# Patient Record
Sex: Female | Born: 1995 | Race: Black or African American | Hispanic: No | Marital: Married | Smoking: Never smoker
Health system: Southern US, Community
[De-identification: ages and names within clinical notes are randomized; demographics above are authoritative.]

## PROBLEM LIST (undated history)

## (undated) DIAGNOSIS — F419 Anxiety disorder, unspecified: Secondary | ICD-10-CM

## (undated) DIAGNOSIS — J45909 Unspecified asthma, uncomplicated: Secondary | ICD-10-CM

## (undated) DIAGNOSIS — M519 Unspecified thoracic, thoracolumbar and lumbosacral intervertebral disc disorder: Secondary | ICD-10-CM

## (undated) DIAGNOSIS — F329 Major depressive disorder, single episode, unspecified: Secondary | ICD-10-CM

## (undated) DIAGNOSIS — M549 Dorsalgia, unspecified: Secondary | ICD-10-CM

## (undated) DIAGNOSIS — K259 Gastric ulcer, unspecified as acute or chronic, without hemorrhage or perforation: Secondary | ICD-10-CM

## (undated) DIAGNOSIS — T7840XA Allergy, unspecified, initial encounter: Secondary | ICD-10-CM

## (undated) DIAGNOSIS — F32A Depression, unspecified: Secondary | ICD-10-CM

## (undated) HISTORY — DX: Anxiety disorder, unspecified: F41.9

## (undated) HISTORY — DX: Major depressive disorder, single episode, unspecified: F32.9

## (undated) HISTORY — DX: Allergy, unspecified, initial encounter: T78.40XA

## (undated) HISTORY — DX: Gastric ulcer, unspecified as acute or chronic, without hemorrhage or perforation: K25.9

## (undated) HISTORY — DX: Dorsalgia, unspecified: M54.9

## (undated) HISTORY — DX: Depression, unspecified: F32.A

---
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
## 2015-01-15 ENCOUNTER — Ambulatory Visit: Payer: Medicaid Other | Attending: Family Medicine | Admitting: Physical Therapy

## 2015-01-15 ENCOUNTER — Encounter: Payer: Self-pay | Admitting: Physical Therapy

## 2015-01-15 DIAGNOSIS — M79672 Pain in left foot: Secondary | ICD-10-CM

## 2015-01-15 DIAGNOSIS — M25562 Pain in left knee: Secondary | ICD-10-CM | POA: Diagnosis present

## 2015-01-15 DIAGNOSIS — R262 Difficulty in walking, not elsewhere classified: Secondary | ICD-10-CM | POA: Diagnosis not present

## 2015-01-15 NOTE — Therapy (Signed)
[facility] MAIN [facility] SERVICES [address] [place], [place], [address] Phone: [phone]   Fax:  [phone]  Physical Therapy Evaluation  Patient Details  Name: Jordan David MRN: [id number] Date of Birth: 1996-05-12 Referring Provider:  Mickel Fuchs, MD  Encounter Date: 01/15/2015      PT End of Session - 01/15/15 1156    Visit Number 1   Number of Visits 9   Date for PT Re-Evaluation 02/19/15   Authorization Type medicaid   PT Start Time 1047   PT Stop Time 1135   PT Time Calculation (min) 48 min   Activity Tolerance Patient tolerated treatment well;Patient limited by pain   Behavior During Therapy [facility] for tasks assessed/performed      Past Medical History  Diagnosis Date  . Anxiety     controlled with medication  . Depression     controlled with medication  . Stomach ulcer 2015  . Back pain     scoliosis and dislocated disc (>1 year of pain)  . Allergy     History reviewed. No pertinent past surgical history.  There were no vitals filed for this visit.  Visit Diagnosis:  Left foot pain - Plan: PT plan of care cert/re-cert  Left knee pain - Plan: PT plan of care cert/re-cert  Difficulty walking - Plan: PT plan of care cert/re-cert      Subjective Assessment - 01/15/15 1056    Subjective 19 yo Female reports increased LLE pain/knee pain and down to foot. Patient reports increased difficulty standing at work. Patient went to see physician. 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 Patient picked up a 2nd job at [employer].    Pertinent History Patient reports having pain in LLE when she was younger. Went to [facility] and was diagnosed with growing pain; She reports always dragging LLE; She reports increased back pain earlier this year and received PT for it. She reports that her back  was feeling better until she started feeling increased LLE pain. She  reports not following up with anyone about her back at this time.    Limitations Standing;Walking   How long can you sit comfortably? 20 min (increased back pain); no pain with sitting in LLE;   How long can you stand comfortably? 10 min   How long can you walk comfortably? reports limping with gait (able to walk >500 feet)   Diagnostic tests Had radiographs of LLE when younger, no abnormalities reported;    Patient Stated Goals reduce pain, be able to work without difficulty   Currently in Pain? Yes   Pain Score 6    Pain Location Knee   Pain Orientation Left   Pain Descriptors / Indicators Aching;Throbbing   Pain Type Chronic pain   Pain Radiating Towards left foot   Pain Onset More than a month ago   Pain Frequency Constant   Aggravating Factors  standing/walking   Pain Relieving Factors rest (lying down)   Effect of Pain on Daily Activities decreased standing tolerance at work;             [facility] PT Assessment - 01/15/15 1101    Assessment   Medical Diagnosis LLE pain/knee and foot pain (plantar fasciitis, pes planus)   Onset Date/Surgical Date 12/11/14  felt sharp pain, pain has stayed since; no injury  reported;   Hand Dominance Right   Next MD Visit none scheduled   Prior Therapy had PT earlier in the year for back pain; reports no PT for this condition;    Precautions   Precautions None   Restrictions   Weight Bearing Restrictions No   Balance Screen   Has the patient fallen in the past 6 months No   Has the patient had a decrease in activity level because of a fear of falling?  No   Is the patient reluctant to leave their home because of a fear of falling?  Yes   Home Environment   Additional Comments lives with mom; lives in single story home with 5-6 steps to enter house with R rail; has to negotiate steps one step at a time due to pain;    Prior Function   Level of Independence Independent    Vocation Part time employment  2 part time jobs  (works 40+ hours a week)   Gaffer standing, lifting   Leisure travel/bowling/shopping   Cognition   Overall Cognitive Status Within Functional Limits for tasks assessed   Observation/Other Assessments   Observations demonstrates increased lumbar lordosis in standing; equal shoulder height bilaterally and equal hip height; increased flat foot bilaterally and increased knee valgus in standing (mild)   Skin Integrity intact by gross assessment   Sensation   Additional Comments sometimes will have numbness in LLE knee/ankle; intact light touch during evaluation;    AROM   Overall AROM Comments BUE and BLE are WFL except left ankle; lumbar ROM: normal lumbar flexion/extension, rotation and side bend bilaterally; increased pain with lumbar extension;    Right Ankle Dorsiflexion 14   Right Ankle Plantar Flexion 80   Right Ankle Inversion 40   Right Ankle Eversion 20   Left Ankle Dorsiflexion -5   Left Ankle Plantar Flexion 60   Left Ankle Inversion 25   Left Ankle Eversion 23   Strength   Overall Strength Comments BUE and BLE are WFL, LLE: knee grossly 4-/5, ankle: 4/5   Palpation   Patella mobility hypermobile LLE lateral patellar glides as compared to right; normal inferior/superior bilaterally; no pain reported with patella glides   Palpation comment reports tenderness along left tibial plateu and quad tendon   Anterior drawer test   Findings Negative   Side Left   Posterior drawer test   Findings Negative   Side  Left   Other   comment negative valgus/varus stress test bilaterally;   Straight Leg Raise   Findings Negative   Side  Left   Comment no back pain; increased tightness in hamstring   Patellofemoral Apprehension Test    Findings Negative   Side  Left   Transfers   Comments able to transfer sit<>Stand without HHA from regular height chair   Ambulation/Gait   Gait Comments ambulates with slight antalgic  gait pattern left with short stance time and decreased step length on RLE; Patient ambulates with increased toe out/wide base of support with increased lumbar lordosis;    Standardized Balance Assessment   Five times sit to stand comments  10.5 sec without HHA (>10 sec indicates increased risk for falls)        Initiated HEP instructing patient in standing calf stretch against wall, 20 sec x1 bilaterally and heel off step stretch 20 sec hold x1 bilaterally; See patient instructions;  PT Education - 01/15/15 1155    Education provided Yes   Education Details HEP- see patient instructions   Person(s) Educated Patient   Methods Explanation;Verbal cues;Handout   Comprehension Verbalized understanding;Verbal cues required             PT Long Term Goals - 01/15/15 1202    PT LONG TERM GOAL #1   Title Patient will increase BLE gross strength to 5/5 as to improve functional strength for independent gait, increased standing tolerance and increased ADL ability by 02/19/15   Baseline LLE knee grossly 4-/5, ankle 4/5   Time 4   Period Weeks   Status New   PT LONG TERM GOAL #2   Title Patient will report a worst pain of 3/10 on VAS in  LLE foot/leg  to improve tolerance with ADLs and reduced symptoms with activities by 02/19/15   Baseline worst pain of 9/10 with standing/walking   Time 4   Period Weeks   Status New   PT LONG TERM GOAL #3   Title Patient will increase LLE ankle AROM: DF: 10 degrees, IV: >30 degrees to improve functional ROM for gait/walking tasks by 02/19/15   Baseline LLE: ankle AROM: DF - 5 degrees, PF: 60 degrees, IV: 25 degrees, EV: 23 degrees   Time 4   Period Weeks   Status New   PT LONG TERM GOAL #4   Title Patient will ascend/descend 4+stairs without rail assist independently with reciprocal pattern to improve ability to get in/out of home by 02/19/15   Baseline currently needs rail assist and negotiates steps one step at a time    Time 4   Period Weeks   Status New   PT LONG TERM GOAL #5   Title Patient will be independent in home exercise program to improve strength/mobility for better functional independence with ADLs by 02/19/15   Baseline Patient does not have a HEP   Time 4   Period Weeks   Status New               Plan - 01/15/15 1156    Clinical Impression Statement 19 yo Female presents to therapy with increased LLE pain. Patient reports increased pain last month with insideous onset. Patient exhibits increased LLE knee valgus, increased LLE flat foot with wide base of support and increased toe out in stance. Patient reports increased pain with LLE heel which radiates up to left knee and then increases to back pain. Patient exhibits decreased LLE ankle ROM and increased tenderness along plantar fascia. She exhibits good knee stability with negative laxity tests. She does have increased LLE lateral patella mobility during mobilization as compared to RLE. Patient ambulates with slight antalgic gait pattern left due to increased pain. Patient currently ambulates either barefoot or with non-supportive shoes. She would benefit from education on supportive shoewear to reduce foot pain. She would benefit from additional skilled PT intervention to improve LLE rom, reduce pain and increase tolerance with ADLs.    Pt will benefit from skilled therapeutic intervention in order to improve on the following deficits Decreased strength;Difficulty walking;Postural dysfunction;Decreased mobility;Impaired flexibility;Decreased range of motion;Hypermobility;Pain;Decreased endurance   Rehab Potential Good   Clinical Impairments Affecting Rehab Potential positive: young age, minimal co-morbidities; negative: chronic pain   PT Frequency 2x / week   PT Duration 4 weeks   PT Treatment/Interventions Gait training;Manual techniques;Functional mobility training;Therapeutic activities;Therapeutic exercise;Cryotherapy;Electrical  Stimulation;Iontophoresis 4mg /ml Dexamethasone;Balance training;Neuromuscular re-education;Passive range of motion;Dry needling;Moist Heat;Ultrasound;Patient/family education;Taping;Orthotic Fit/Training   PT Next  Visit Plan LEFs, initiate HEP   PT Home Exercise Plan see patient instructions   Consulted and Agree with Plan of Care Patient         Problem List There are no active problems to display for this patient.   Hopkins,Margaret, PT, DPT 01/15/2015, 12:08 PM  [facility] [address] [place], [place], [address] Phone: [phone]   Fax:  [phone]

## 2015-01-15 NOTE — Patient Instructions (Signed)
Achilles Tendon Stretch   Stand with hands supported on wall, elbows slightly bent, feet parallel and both heels on floor, front knee bent, back knee straight. Slowly relax back knee until a stretch is felt in achilles tendon. Hold _20___ seconds. Repeat with leg positions switched.    Copyright  VHI. All rights reserved.  ANKLE: Dorsiflexion, Step Unilateral   Stand on step, hang one heel off back of step. Hold _20__ seconds. __3_ reps per set, __2_ sets per day, _5_ days per week Hold onto a support.  Copyright  VHI. All rights reserved.

## 2015-01-20 ENCOUNTER — Encounter: Payer: Medicaid Other | Admitting: Physical Therapy

## 2015-01-22 ENCOUNTER — Encounter: Payer: Medicaid Other | Admitting: Physical Therapy

## 2015-01-27 ENCOUNTER — Encounter: Payer: Medicaid Other | Admitting: Physical Therapy

## 2015-01-30 ENCOUNTER — Ambulatory Visit: Payer: Medicaid Other | Attending: Family Medicine | Admitting: Physical Therapy

## 2015-01-30 ENCOUNTER — Encounter: Payer: Self-pay | Admitting: Physical Therapy

## 2015-01-30 DIAGNOSIS — M79672 Pain in left foot: Secondary | ICD-10-CM | POA: Insufficient documentation

## 2015-01-30 DIAGNOSIS — R262 Difficulty in walking, not elsewhere classified: Secondary | ICD-10-CM | POA: Diagnosis present

## 2015-01-30 DIAGNOSIS — M25562 Pain in left knee: Secondary | ICD-10-CM | POA: Diagnosis present

## 2015-01-30 NOTE — Therapy (Signed)
[facility] MAIN [facility] SERVICES [address] [place], [place], [address] Phone: [phone]   Fax:  [phone]  Physical Therapy Treatment  Patient Details  Name: Jordan David MRN: [id number] Date of Birth: 08/05/1995 Referring Provider:  Mickel Fuchs, MD  Encounter Date: 01/30/2015      PT End of Session - 01/30/15 1707    Visit Number (p) 2   Number of Visits (p) 9   Date for PT Re-Evaluation (p) 02/19/15   Authorization Type (p) medicaid   Activity Tolerance (p) Patient tolerated treatment well;Patient limited by pain   Behavior During Therapy (p) WFL for tasks assessed/performed      Past Medical History  Diagnosis Date  . Anxiety     controlled with medication  . Depression     controlled with medication  . Stomach ulcer 2015  . Back pain     scoliosis and dislocated disc (>1 year of pain)  . Allergy     History reviewed. No pertinent past surgical history.  There were no vitals filed for this visit.  Visit Diagnosis:  Left foot pain  Left knee pain  Difficulty walking      Subjective Assessment - 01/30/15 1659    Subjective Patient is having 8/10 pain to left foot and she has been advised to get a more supportative shoe and ice it often. She was instructed in stretching it and the importance of performing her HEP.    Pertinent History Patient reports having pain in LLE when she was younger. Went to [facility] and was diagnosed with growing pain; She reports always dragging LLE; She reports increased back pain earlier this year and received PT for it. She reports that her back was feeling better until she started feeling increased LLE pain. She  reports not following up with anyone about her back at this time.    Limitations Standing;Walking   How long can you sit comfortably? 20 min (increased back pain); no pain with sitting in LLE;   How long can you stand comfortably? 10 min   How long can you walk  comfortably? reports limping with gait (able to walk >500 feet)   Diagnostic tests Had radiographs of LLE when younger, no abnormalities reported;    Patient Stated Goals reduce pain, be able to work without difficulty   Pain Score 8    Pain Location --  foot   Pain Type Chronic pain   Pain Onset More than a month ago   Aggravating Factors  Standing /walking      Patient was seen for standing stretch to gastroc and soleus 30 sec x 3 x 2 reps Stair stretch for gastroc and soleus 30 sec x 3 BLE  Ice to left foot  [place] to left heel and foot x 10 minutes at 1.5 cm squared Patient is having 8/10 pain to left heel.  Patient was educated that she needs to get a better shoe to support her foot, ice and stretch her foot often.                              PT Education - 01/30/15 1706    Education provided Yes   Education Details ice and stretch multiple times per day and get suportative shoes   Person(s) Educated Patient   Methods Explanation   Comprehension Verbalized understanding  PT Long Term Goals - 01/15/15 1202    PT LONG TERM GOAL #1   Title Patient will increase BLE gross strength to 5/5 as to improve functional strength for independent gait, increased standing tolerance and increased ADL ability by 02/19/15   Baseline LLE knee grossly 4-/5, ankle 4/5   Time 4   Period Weeks   Status New   PT LONG TERM GOAL #2   Title Patient will report a worst pain of 3/10 on VAS in  LLE foot/leg  to improve tolerance with ADLs and reduced symptoms with activities by 02/19/15   Baseline worst pain of 9/10 with standing/walking   Time 4   Period Weeks   Status New   PT LONG TERM GOAL #3   Title Patient will increase LLE ankle AROM: DF: 10 degrees, IV: >30 degrees to improve functional ROM for gait/walking tasks by 02/19/15   Baseline LLE: ankle AROM: DF - 5 degrees, PF: 60 degrees, IV: 25 degrees, EV: 23 degrees   Time 4   Period Weeks   Status New    PT LONG TERM GOAL #4   Title Patient will ascend/descend 4+stairs without rail assist independently with reciprocal pattern to improve ability to get in/out of home by 02/19/15   Baseline currently needs rail assist and negotiates steps one step at a time   Time 4   Period Weeks   Status New   PT LONG TERM GOAL #5   Title Patient will be independent in home exercise program to improve strength/mobility for better functional independence with ADLs by 02/19/15   Baseline Patient does not have a HEP   Time 4   Period Weeks   Status New               Problem List There are no active problems to display for this patient.   Ezekiel Ina 01/30/2015, 5:32 PM  [facility] MAIN [facility] SERVICES [address] [place], [place], [address] Phone: [phone]   Fax:  [phone]

## 2015-02-01 IMAGING — CR DG CHEST 2V
1 series · 2 of 2 positions shown · non-contrast
Comparison: none

REASON FOR EXAM: cough, intermittent fevers
COMMENTS:

PROCEDURE:     DXR - DXR CHEST PA (OR AP) AND LATERAL  - September 18, 2012  [DATE]
RESULT:     The lungs are clear. The heart and pulmonary vessels are normal.
The bony and mediastinal structures are unremarkable. There is no effusion.
There is no pneumothorax or evidence of congestive failure.

[Series 1: w chest pa · 0.14mm/px · 2 of 2 slices shown]
[im 1/2]
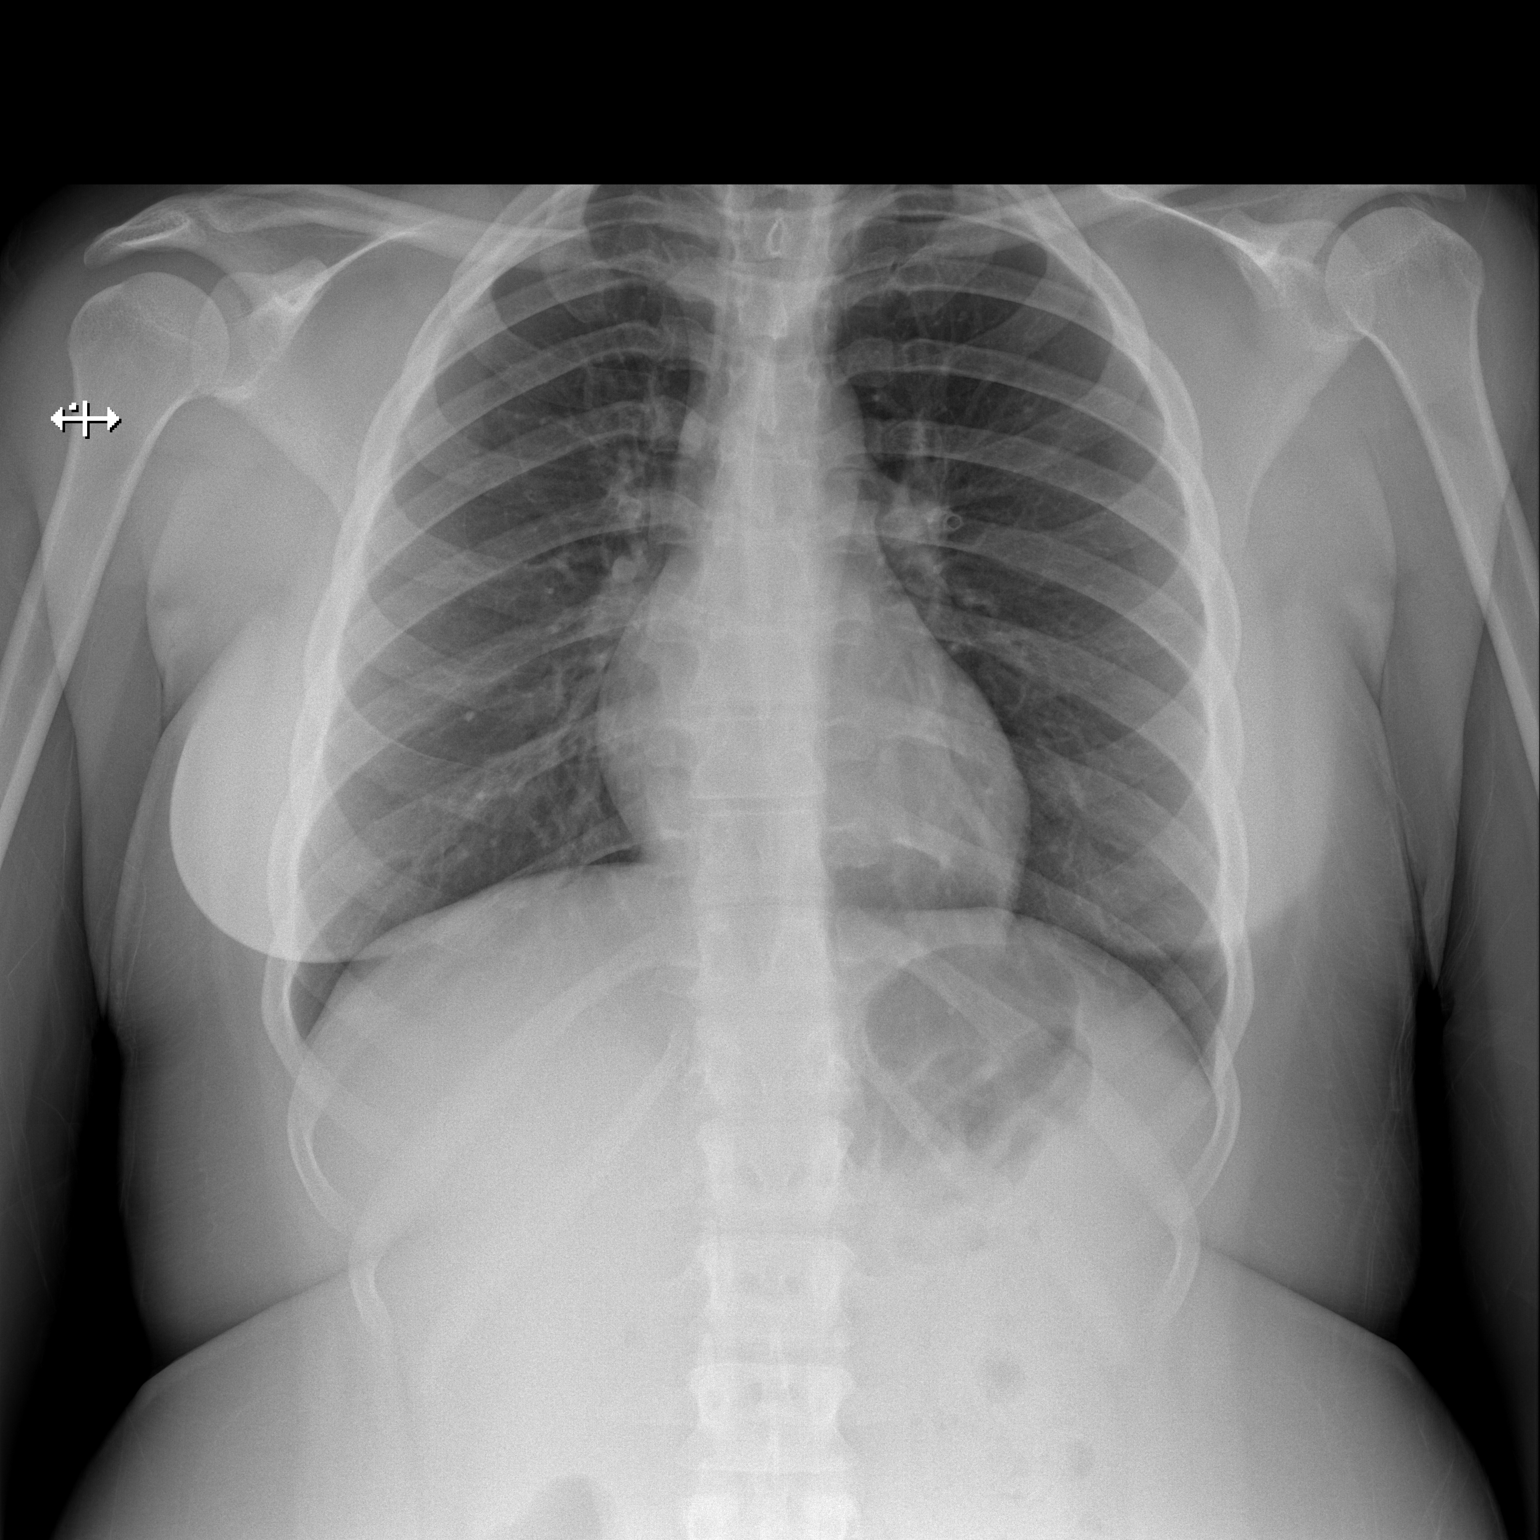
[im 2/2]
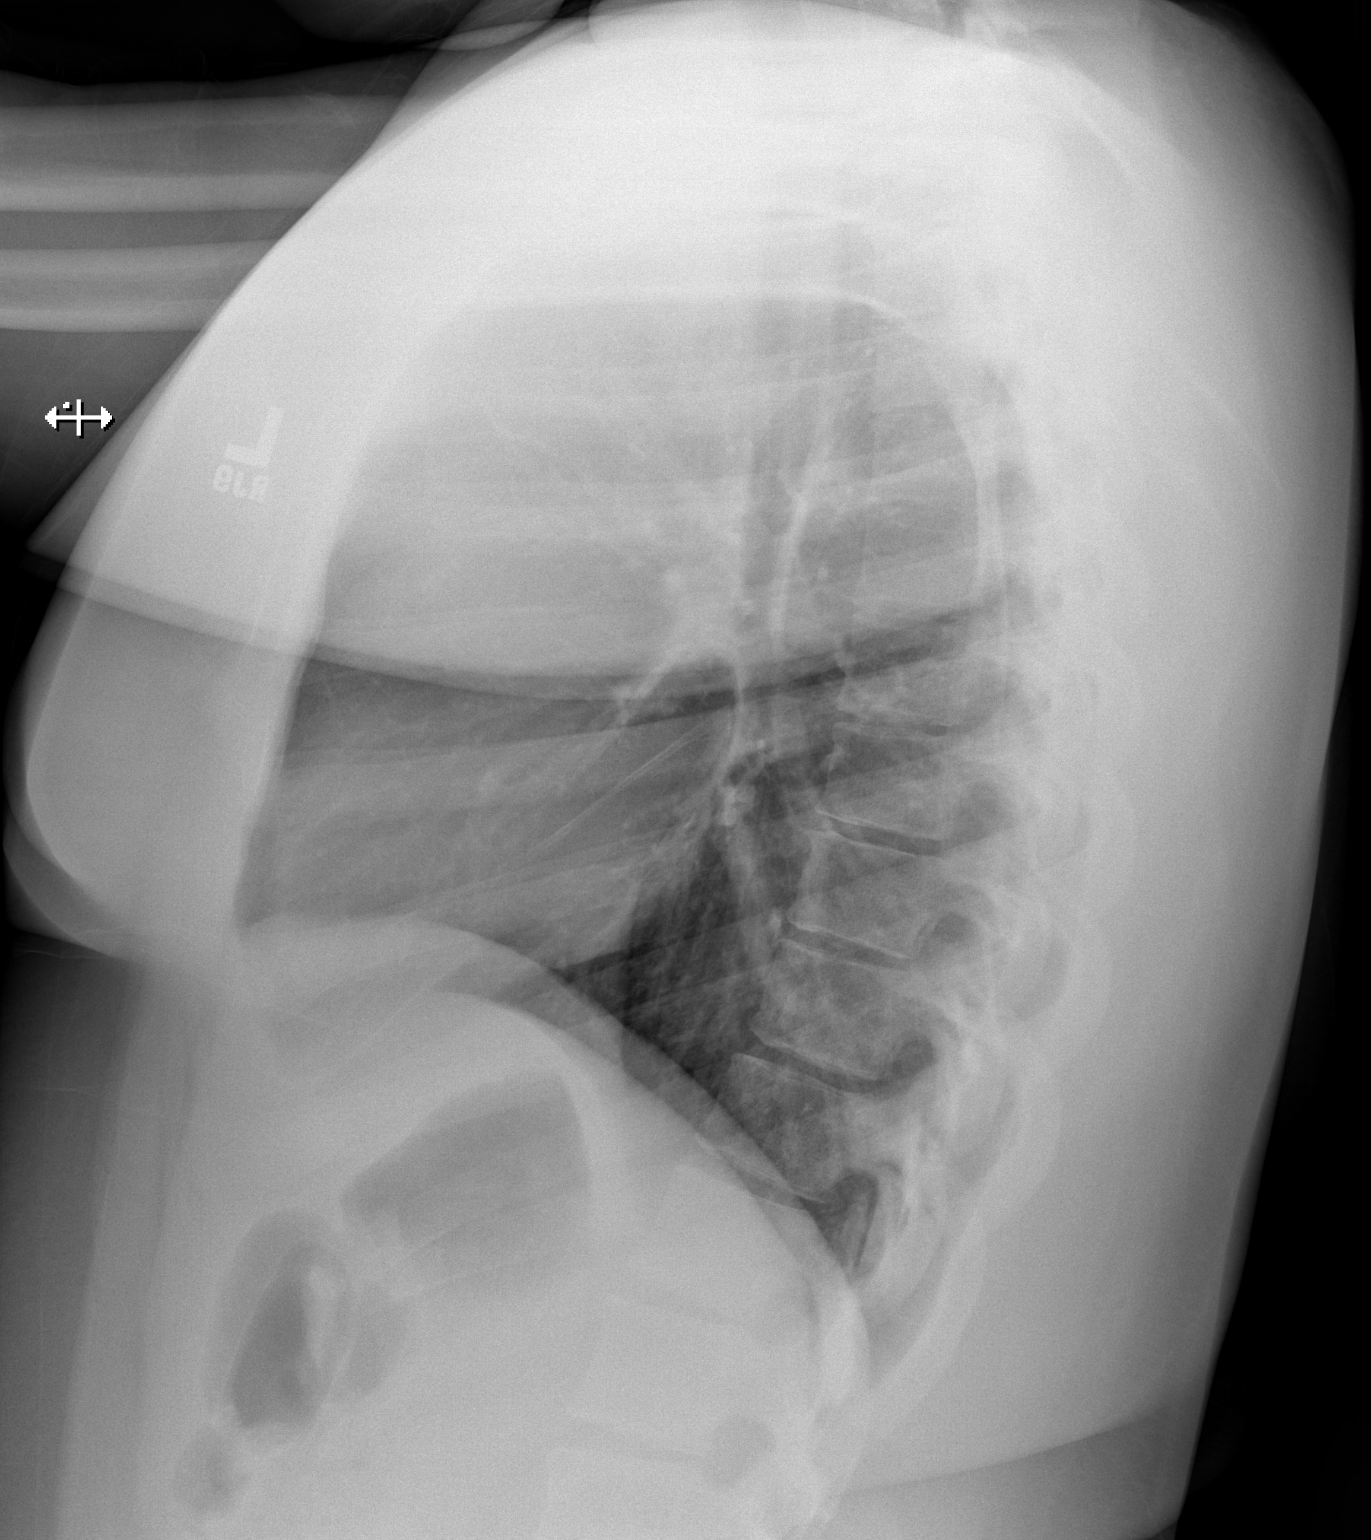

[2 of 2 positions shown; findings below may reference images not displayed]

IMPRESSION: No acute cardiopulmonary disease.

[REDACTED]

## 2015-02-03 ENCOUNTER — Emergency Department
Admission: EM | Admit: 2015-02-03 | Discharge: 2015-02-03 | Disposition: A | Discharge status: Home / Self Care | Payer: Medicaid Other | Attending: Emergency Medicine | Admitting: Emergency Medicine

## 2015-02-03 ENCOUNTER — Encounter: Payer: Self-pay | Admitting: Emergency Medicine

## 2015-02-03 ENCOUNTER — Ambulatory Visit: Payer: Medicaid Other | Admitting: Physical Therapy

## 2015-02-03 DIAGNOSIS — K529 Noninfective gastroenteritis and colitis, unspecified: Secondary | ICD-10-CM | POA: Insufficient documentation

## 2015-02-03 DIAGNOSIS — Z3202 Encounter for pregnancy test, result negative: Secondary | ICD-10-CM | POA: Insufficient documentation

## 2015-02-03 DIAGNOSIS — R112 Nausea with vomiting, unspecified: Secondary | ICD-10-CM | POA: Diagnosis present

## 2015-02-03 LAB — URINALYSIS COMPLETE WITH MICROSCOPIC (ARMC ONLY)
Bacteria, UA: NONE SEEN
Bilirubin Urine: NEGATIVE
Glucose, UA: 50 mg/dL — AB
HGB URINE DIPSTICK: NEGATIVE
KETONES UR: NEGATIVE mg/dL
Nitrite: NEGATIVE
PH: 6 (ref 5.0–8.0)
PROTEIN: NEGATIVE mg/dL
SPECIFIC GRAVITY, URINE: 1.015 (ref 1.005–1.030)

## 2015-02-03 LAB — COMPREHENSIVE METABOLIC PANEL
ALT: 26 U/L (ref 14–54)
AST: 22 U/L (ref 15–41)
Albumin: 3.8 g/dL (ref 3.5–5.0)
Alkaline Phosphatase: 79 U/L (ref 38–126)
Anion gap: 8 (ref 5–15)
BUN: 8 mg/dL (ref 6–20)
CALCIUM: 9.1 mg/dL (ref 8.9–10.3)
CHLORIDE: 104 mmol/L (ref 101–111)
CO2: 25 mmol/L (ref 22–32)
Creatinine, Ser: 0.66 mg/dL (ref 0.44–1.00)
GFR calc Af Amer: >60 mL/min (ref >60)
GLUCOSE: 152 mg/dL — AB (ref 65–99)
Potassium: 3.6 mmol/L (ref 3.5–5.1)
Sodium: 137 mmol/L (ref 135–145)
TOTAL PROTEIN: 7.4 g/dL (ref 6.5–8.1)
Total Bilirubin: 0.2 mg/dL — ABNORMAL LOW (ref 0.3–1.2)

## 2015-02-03 LAB — CBC
HCT: 44.1 % (ref 35.0–47.0)
HEMOGLOBIN: 14.5 g/dL (ref 12.0–16.0)
MCH: 28.4 pg (ref 26.0–34.0)
MCHC: 32.9 g/dL (ref 32.0–36.0)
MCV: 86.5 fL (ref 80.0–100.0)
PLATELETS: 326 10*3/uL (ref 150–440)
RBC: 5.1 MIL/uL (ref 3.80–5.20)
RDW: 13 % (ref 11.5–14.5)
WBC: 8.6 10*3/uL (ref 3.6–11.0)

## 2015-02-03 LAB — POCT PREGNANCY, URINE: PREG TEST UR: NEGATIVE

## 2015-02-03 LAB — LIPASE, BLOOD: Lipase: 42 U/L (ref 22–51)

## 2015-02-03 MED ORDER — MORPHINE SULFATE 4 MG/ML IJ SOLN
4.0000 mg | Freq: Once | INTRAMUSCULAR | Status: AC
  Administered 2015-02-03: 4 mg via INTRAVENOUS

## 2015-02-03 MED ORDER — MORPHINE SULFATE 4 MG/ML IJ SOLN
INTRAMUSCULAR | Status: AC
  Filled 2015-02-03: qty 1

## 2015-02-03 MED ORDER — LOPERAMIDE HCL 2 MG PO TABS: 2.0000 mg | ORAL_TABLET | Freq: Four times a day (QID) | ORAL | Status: DC | PRN

## 2015-02-03 MED ORDER — ONDANSETRON 4 MG PO TBDP: 4.0000 mg | ORAL_TABLET | Freq: Three times a day (TID) | ORAL | Status: DC | PRN

## 2015-02-03 MED ORDER — ONDANSETRON HCL 4 MG/2ML IJ SOLN
4.0000 mg | Freq: Once | INTRAMUSCULAR | Status: AC
  Administered 2015-02-03: 4 mg via INTRAVENOUS

## 2015-02-03 MED ORDER — ONDANSETRON HCL 4 MG/2ML IJ SOLN
INTRAMUSCULAR | Status: AC
  Filled 2015-02-03: qty 2

## 2015-02-03 MED ORDER — SODIUM CHLORIDE 0.9 % IV SOLN
Freq: Once | INTRAVENOUS | Status: AC
  Administered 2015-02-03: 21:00:00 via INTRAVENOUS

## 2015-02-03 NOTE — ED Provider Notes (Addendum)
[facility] Emergency Department Provider Note     Time seen: ----------------------------------------- 8:31 PM on 02/03/2015 -----------------------------------------    I have reviewed the triage vital signs and the nursing notes.   HISTORY  Chief Complaint Emesis    HPI Jordan David is a 19 y.o. female who presents to ER with several days of nausea vomiting and diarrhea. Patient is concerned she may be pregnant as well. She reports seeing her primary care doctor 2 days ago was diagnosed with a 24-hour stomach virus. She complains of persistent nausea and vomiting but has not taken her antiemetics as she didn't think he was given a help.   Past Medical History  Diagnosis Date  . Anxiety     controlled with medication  . Depression     controlled with medication  . Stomach ulcer 2015  . Back pain     scoliosis and dislocated disc (>1 year of pain)  . Allergy     There are no active problems to display for this patient.   History reviewed. No pertinent past surgical history.  Allergies Review of patient's allergies indicates no known allergies.  Social History History  Substance Use Topics  . Smoking status: Never Smoker   . Smokeless tobacco: Not on file  . Alcohol Use: Not on file    Review of Systems Constitutional: Negative for fever. Eyes: Negative for visual changes. ENT: Negative for sore throat. Cardiovascular: Negative for chest pain. Respiratory: Negative for shortness of breath. Gastrointestinal: Positive for abdominal pain, vomiting and diarrhea Genitourinary: Negative for dysuria. Negative for vaginal bleeding or discharge Musculoskeletal: Negative for back pain. Skin: Negative for rash. Neurological: Negative for headaches, focal weakness or numbness.  10-point ROS otherwise negative.  ____________________________________________   PHYSICAL EXAM:  VITAL SIGNS: ED Triage Vitals  Enc Vitals Group     BP  02/03/15 2007 117/64 mmHg     Pulse Rate 02/03/15 2007 94     Resp 02/03/15 2007 20     Temp 02/03/15 2007 98 F (36.7 C)     Temp Source 02/03/15 2007 Oral     SpO2 02/03/15 2007 100 %     Weight 02/03/15 2007 246 lb (111.585 kg)     Height 02/03/15 2007  (1.6 m)     Head Cir --      Peak Flow --      Pain Score 02/03/15 2007 5     Pain Loc --      Pain Edu? --      Excl. in GC? --     Constitutional: Alert and oriented. Well appearing and in no distress. Eyes: Conjunctivae are normal. PERRL. Normal extraocular movements. ENT   Head: Normocephalic and atraumatic.   Nose: No congestion/rhinnorhea.   Mouth/Throat: Mucous membranes are moist.   Neck: No stridor. Cardiovascular: Normal rate, regular rhythm. Normal and symmetric distal pulses are present in all extremities. No murmurs, rubs, or gallops. Respiratory: Normal respiratory effort without tachypnea nor retractions. Breath sounds are clear and equal bilaterally. No wheezes/rales/rhonchi. Gastrointestinal: Soft and nontender. No distention. No abdominal bruits.  Musculoskeletal: Nontender with normal range of motion in all extremities. No joint effusions.  No lower extremity tenderness nor edema. Neurologic:  Normal speech and language. No gross focal neurologic deficits are appreciated. Speech is normal. No gait instability. Skin:  Skin is warm, dry and intact. No rash noted. Psychiatric: Mood and affect are normal. Speech and behavior are normal. Patient exhibits appropriate insight and  judgment. ____________________________________________  ED COURSE:  Pertinent labs & imaging results that were available during my care of the patient were reviewed by me and considered in my medical decision making (see chart for details). Patient will need basic labs, IV fluid and antiemetics ____________________________________________    LABS (pertinent positives/negatives)  Labs Reviewed  COMPREHENSIVE METABOLIC  PANEL - Abnormal; Notable for the following:    Glucose, Bld 152 (*)    Total Bilirubin 0.2 (*)    All other components within normal limits  URINALYSIS COMPLETEWITH MICROSCOPIC ([facility] ONLY) - Abnormal; Notable for the following:    Color, Urine YELLOW (*)    APPearance CLEAR (*)    Glucose, UA 50 (*)    Leukocytes, UA TRACE (*)    Squamous Epithelial / LPF 0-5 (*)    All other components within normal limits  LIPASE, BLOOD  CBC  POC URINE PREG, ED  POCT PREGNANCY, URINE    ____________________________________________  FINAL ASSESSMENT AND PLAN  Gastroenteritis  Plan: Patient with labs as dictated above. Patient is in no acute distress, labs are unremarkable, likely still with gastroenteritis. Encouraged to take antiemetics and over-the-counter antidiarrheal agents. She is stable for outpatient follow-up   Emily Filbert, MD   Emily Filbert, MD 02/03/15 1610  Emily Filbert, MD 02/03/15 2136

## 2015-02-03 NOTE — Discharge Instructions (Signed)

## 2015-02-03 NOTE — ED Notes (Addendum)
Patient ambulatory to triage with steady gait, without difficulty or distress noted; pt reports seeing PCP 2days ago and dx with "24 hour stomach virus"; c/o persistent N/V; pt st was rx anti-emetics but has not taken it because "I didn't think it was gonna help"

## 2015-02-05 ENCOUNTER — Ambulatory Visit: Payer: Medicaid Other | Admitting: Physical Therapy

## 2015-02-10 ENCOUNTER — Ambulatory Visit: Payer: Medicaid Other | Admitting: Physical Therapy

## 2015-02-11 ENCOUNTER — Ambulatory Visit: Payer: Medicaid Other | Admitting: Physical Therapy

## 2015-02-12 ENCOUNTER — Ambulatory Visit: Payer: Medicaid Other | Admitting: Physical Therapy

## 2015-02-17 ENCOUNTER — Ambulatory Visit: Payer: Medicaid Other | Admitting: Physical Therapy

## 2015-02-19 ENCOUNTER — Ambulatory Visit: Payer: Medicaid Other | Admitting: Physical Therapy

## 2015-03-23 ENCOUNTER — Encounter: Payer: Self-pay | Admitting: Emergency Medicine

## 2015-03-23 DIAGNOSIS — Z3202 Encounter for pregnancy test, result negative: Secondary | ICD-10-CM | POA: Insufficient documentation

## 2015-03-23 DIAGNOSIS — R1013 Epigastric pain: Secondary | ICD-10-CM | POA: Diagnosis not present

## 2015-03-23 DIAGNOSIS — R1011 Right upper quadrant pain: Secondary | ICD-10-CM | POA: Diagnosis present

## 2015-03-23 LAB — LIPASE, BLOOD: Lipase: 36 U/L (ref 22–51)

## 2015-03-23 LAB — COMPREHENSIVE METABOLIC PANEL
ALT: 29 U/L (ref 14–54)
ANION GAP: 8 (ref 5–15)
AST: 21 U/L (ref 15–41)
Albumin: 4.2 g/dL (ref 3.5–5.0)
Alkaline Phosphatase: 55 U/L (ref 38–126)
BUN: 11 mg/dL (ref 6–20)
CHLORIDE: 105 mmol/L (ref 101–111)
CO2: 26 mmol/L (ref 22–32)
Calcium: 8.8 mg/dL — ABNORMAL LOW (ref 8.9–10.3)
Creatinine, Ser: 0.71 mg/dL (ref 0.44–1.00)
GFR calc Af Amer: >60 mL/min (ref >60)
GFR calc non Af Amer: >60 mL/min (ref >60)
Glucose, Bld: 87 mg/dL (ref 65–99)
Potassium: 3.7 mmol/L (ref 3.5–5.1)
SODIUM: 139 mmol/L (ref 135–145)
Total Bilirubin: 1 mg/dL (ref 0.3–1.2)
Total Protein: 7.4 g/dL (ref 6.5–8.1)

## 2015-03-23 LAB — CBC
HCT: 41.5 % (ref 35.0–47.0)
HEMOGLOBIN: 14 g/dL (ref 12.0–16.0)
MCH: 29 pg (ref 26.0–34.0)
MCHC: 33.8 g/dL (ref 32.0–36.0)
MCV: 85.9 fL (ref 80.0–100.0)
Platelets: 345 10*3/uL (ref 150–440)
RBC: 4.83 MIL/uL (ref 3.80–5.20)
RDW: 12.9 % (ref 11.5–14.5)
WBC: 9.5 10*3/uL (ref 3.6–11.0)

## 2015-03-23 NOTE — ED Notes (Signed)
Patient ambulatory to triage with steady gait, without difficulty or distress noted; pt reports nausea and sharp generalized abd pain since last week, "like gas", increasing in intensity since Thursday; denies hx of same

## 2015-03-24 ENCOUNTER — Emergency Department
Admission: EM | Admit: 2015-03-24 | Discharge: 2015-03-24 | Disposition: A | Discharge status: Home / Self Care | Payer: Medicaid Other | Attending: Emergency Medicine | Admitting: Emergency Medicine

## 2015-03-24 ENCOUNTER — Emergency Department: Payer: Medicaid Other

## 2015-03-24 DIAGNOSIS — R1013 Epigastric pain: Secondary | ICD-10-CM

## 2015-03-24 LAB — URINALYSIS COMPLETE WITH MICROSCOPIC (ARMC ONLY)
Bacteria, UA: NONE SEEN
Bilirubin Urine: NEGATIVE
Glucose, UA: NEGATIVE mg/dL
Hgb urine dipstick: NEGATIVE
KETONES UR: NEGATIVE mg/dL
Leukocytes, UA: NEGATIVE
Nitrite: NEGATIVE
PROTEIN: NEGATIVE mg/dL
Specific Gravity, Urine: 1.024 (ref 1.005–1.030)
pH: 5 (ref 5.0–8.0)

## 2015-03-24 LAB — POCT PREGNANCY, URINE: Preg Test, Ur: NEGATIVE

## 2015-03-24 MED ORDER — NAPROXEN 500 MG PO TABS
ORAL_TABLET | ORAL | Status: AC
  Filled 2015-03-24: qty 1

## 2015-03-24 MED ORDER — ONDANSETRON 4 MG PO TBDP
4.0000 mg | ORAL_TABLET | Freq: Once | ORAL | Status: AC
  Administered 2015-03-24: 4 mg via ORAL
  Filled 2015-03-24: qty 1

## 2015-03-24 MED ORDER — FAMOTIDINE 20 MG PO TABS: 20.0000 mg | ORAL_TABLET | Freq: Two times a day (BID) | ORAL | Status: DC

## 2015-03-24 MED ORDER — MORPHINE SULFATE (PF) 2 MG/ML IV SOLN
2.0000 mg | Freq: Once | INTRAVENOUS | Status: AC
Start: 2015-03-24 — End: 2015-03-24
  Administered 2015-03-24: 2 mg via INTRAMUSCULAR
  Filled 2015-03-24: qty 1

## 2015-03-24 NOTE — Discharge Instructions (Signed)
1. Start Pepcid 20 mg twice daily. 2. Return to the ER for worsening symptoms, persistent vomiting, difficulty breathing or other concerns.  Abdominal Pain Many things can cause abdominal pain. Usually, abdominal pain is not caused by a disease and will improve without treatment. It can often be observed and treated at home. Your health care provider will do a physical exam and possibly order blood tests and X-rays to help determine the seriousness of your pain. However, in many cases, more time must pass before a clear cause of the pain can be found. Before that point, your health care provider may not know if you need more testing or further treatment. HOME CARE INSTRUCTIONS  Monitor your abdominal pain for any changes. The following actions may help to alleviate any discomfort you are experiencing:  Only take over-the-counter or prescription medicines as directed by your health care provider.  Do not take laxatives unless directed to do so by your health care provider.  Try a clear liquid diet (broth, tea, or water) as directed by your health care provider. Slowly move to a bland diet as tolerated. SEEK MEDICAL CARE IF:  You have unexplained abdominal pain.  You have abdominal pain associated with nausea or diarrhea.  You have pain when you urinate or have a bowel movement.  You experience abdominal pain that wakes you in the night.  You have abdominal pain that is worsened or improved by eating food.  You have abdominal pain that is worsened with eating fatty foods.  You have a fever. SEEK IMMEDIATE MEDICAL CARE IF:   Your pain does not go away within 2 hours.  You keep throwing up (vomiting).  Your pain is felt only in portions of the abdomen, such as the right side or the left lower portion of the abdomen.  You pass bloody or black tarry stools. MAKE SURE YOU:  Understand these instructions.   Will watch your condition.   Will get help right away if you are not  doing well or get worse.  Document Released: 03/24/2005 Document Revised: 06/19/2013 Document Reviewed: 02/21/2013 [facility] Patient Information 2015 [place], [place]. This information is not intended to replace advice given to you by your health care provider. Make sure you discuss any questions you have with your health care provider.

## 2015-03-24 NOTE — ED Notes (Signed)
Patient transported to Ultrasound 

## 2015-03-24 NOTE — ED Provider Notes (Signed)
[facility] Emergency Department Erica Richwine Note  ____________________________________________  Time seen: Approximately 4:48 AM  I have reviewed the triage vital signs and the nursing notes.   HISTORY  Chief Complaint Nausea and Abdominal Pain    HPI Jordan David is a 19 y.o. female who presents to the ED from home with a chief complain of abdominal pain. Patient reports a 4-5 day history of sharp, epigastric pain which radiates around to her back. Initially thought it was "gas" and ignored the pain. However, the pain began to increase in intensity 3 days ago. Pain is waxing/waning, often initially improved upon eating, then worsens. Patient denies fever, chills, chest pain, shortness of breath, diarrhea, dysuria. Denies recent travel or surgery. Reports a history of ulcers when she was younger. Currently does not take anything for ulcers. Nothing makes the pain better or worse.   Past Medical History  Diagnosis Date  . Anxiety     controlled with medication  . Depression     controlled with medication  . Stomach ulcer 2015  . Back pain     scoliosis and dislocated disc (>1 year of pain)  . Allergy     There are no active problems to display for this patient.   History reviewed. No pertinent past surgical history.  Current Outpatient Rx  Name  Route  Sig  Dispense  Refill  . loperamide (IMODIUM A-D) 2 MG tablet   Oral   Take 1 tablet (2 mg total) by mouth 4 (four) times daily as needed for diarrhea or loose stools.   30 tablet   0   . ondansetron (ZOFRAN ODT) 4 MG disintegrating tablet   Oral   Take 1 tablet (4 mg total) by mouth every 8 (eight) hours as needed for nausea or vomiting.   20 tablet   0     Allergies Review of patient's allergies indicates no known allergies.  No family history on file.  Social History Social History  Substance Use Topics  . Smoking status: Never Smoker   . Smokeless tobacco: None  . Alcohol Use:  No    Review of Systems Constitutional: No fever/chills Eyes: No visual changes. ENT: No sore throat. Cardiovascular: Denies chest pain. Respiratory: Denies shortness of breath. Gastrointestinal: Positive for abdominal pain.  Positive for nausea, no vomiting.  No diarrhea.  No constipation. Genitourinary: Negative for dysuria. Musculoskeletal: Negative for back pain. Skin: Negative for rash. Neurological: Negative for headaches, focal weakness or numbness.  10-point ROS otherwise negative.  ____________________________________________   PHYSICAL EXAM:  VITAL SIGNS: ED Triage Vitals  Enc Vitals Group     BP 03/23/15 2317 138/85 mmHg     Pulse Rate 03/23/15 2317 94     Resp 03/23/15 2317 18     Temp 03/23/15 2317 98.3 F (36.8 C)     Temp Source 03/23/15 2317 Oral     SpO2 03/23/15 2317 100 %     Weight 03/23/15 2317 256 lb (116.121 kg)     Height 03/23/15 2317  (1.6 m)     Head Cir --      Peak Flow --      Pain Score 03/23/15 2324 8     Pain Loc --      Pain Edu? --      Excl. in GC? --     Constitutional: Alert and oriented. Well appearing and in no acute distress. Eyes: Conjunctivae are normal. PERRL. EOMI. Head: Atraumatic. Nose: No congestion/rhinnorhea.  Mouth/Throat: Mucous membranes are moist.  Oropharynx non-erythematous. Neck: No stridor.   Cardiovascular: Normal rate, regular rhythm. Grossly normal heart sounds.  Good peripheral circulation. Respiratory: Normal respiratory effort.  No retractions. Lungs CTAB. Gastrointestinal: Soft, mildly tender to palpation epigastrium and right upper quadrant without rebound or guarding. No distention. No abdominal bruits. No CVA tenderness. Musculoskeletal: No lower extremity tenderness nor edema.  No joint effusions. Neurologic:  Normal speech and language. No gross focal neurologic deficits are appreciated. No gait instability. Skin:  Skin is warm, dry and intact. No rash noted. Psychiatric: Mood and affect  are normal. Speech and behavior are normal.  ____________________________________________   LABS (all labs ordered are listed, but only abnormal results are displayed)  Labs Reviewed  COMPREHENSIVE METABOLIC PANEL - Abnormal; Notable for the following:    Calcium 8.8 (*)    All other components within normal limits  URINALYSIS COMPLETEWITH MICROSCOPIC ([facility] ONLY) - Abnormal; Notable for the following:    Color, Urine YELLOW (*)    APPearance HAZY (*)    Squamous Epithelial / LPF 6-30 (*)    All other components within normal limits  LIPASE, BLOOD  CBC  POC URINE PREG, ED   ____________________________________________  EKG  none ____________________________________________  RADIOLOGY  Ultrasound abdomen limited RUQ interpreted per Dr. Grace Isaac: Normal right upper quadrant ultrasound. ____________________________________________   PROCEDURES  Procedure(s) performed: None  Critical Care performed: No  ____________________________________________   INITIAL IMPRESSION / ASSESSMENT AND PLAN / ED COURSE  Pertinent labs & imaging results that were available during my care of the patient were reviewed by me and considered in my medical decision making (see chart for details).  19 year old female who presents with a several day history of epigastric and right upper quadrant pain associated with nausea. Will initiate IM analgesia, antiemetic; obtain ultrasound to evaluate for cholecystitis.  ----------------------------------------- 7:06 AM on 03/24/2015 -----------------------------------------  Patient improved. Updated patient of laboratory and imaging results. Will start patient on bid Pepcid; follow-up with PCP. Strict return precautions given. Patient verbalizes understanding and agrees with plan of care. ____________________________________________   FINAL CLINICAL IMPRESSION(S) / ED DIAGNOSES  Final diagnoses:  Epigastric pain      Irean Hong,  MD 03/24/15 [phone]

## 2016-03-22 ENCOUNTER — Emergency Department
Admission: EM | Admit: 2016-03-22 | Discharge: 2016-03-22 | Disposition: A | Discharge status: Home / Self Care | Payer: Medicaid Other | Attending: Emergency Medicine | Admitting: Emergency Medicine

## 2016-03-22 ENCOUNTER — Encounter: Payer: Self-pay | Admitting: *Deleted

## 2016-03-22 DIAGNOSIS — F331 Major depressive disorder, recurrent, moderate: Secondary | ICD-10-CM

## 2016-03-22 DIAGNOSIS — F329 Major depressive disorder, single episode, unspecified: Secondary | ICD-10-CM | POA: Diagnosis not present

## 2016-03-22 DIAGNOSIS — F32A Depression, unspecified: Secondary | ICD-10-CM

## 2016-03-22 DIAGNOSIS — J45909 Unspecified asthma, uncomplicated: Secondary | ICD-10-CM | POA: Diagnosis not present

## 2016-03-22 DIAGNOSIS — Z5181 Encounter for therapeutic drug level monitoring: Secondary | ICD-10-CM | POA: Diagnosis not present

## 2016-03-22 DIAGNOSIS — G4700 Insomnia, unspecified: Secondary | ICD-10-CM

## 2016-03-22 DIAGNOSIS — Z01818 Encounter for other preprocedural examination: Secondary | ICD-10-CM | POA: Diagnosis present

## 2016-03-22 HISTORY — DX: Unspecified asthma, uncomplicated: J45.909

## 2016-03-22 LAB — COMPREHENSIVE METABOLIC PANEL
ALT: 25 U/L (ref 14–54)
AST: 19 U/L (ref 15–41)
Albumin: 4.1 g/dL (ref 3.5–5.0)
Alkaline Phosphatase: 52 U/L (ref 38–126)
Anion gap: 6 (ref 5–15)
BUN: 8 mg/dL (ref 6–20)
CO2: 26 mmol/L (ref 22–32)
Calcium: 9.3 mg/dL (ref 8.9–10.3)
Chloride: 105 mmol/L (ref 101–111)
Creatinine, Ser: 0.71 mg/dL (ref 0.44–1.00)
GFR calc Af Amer: >60 mL/min (ref >60)
GFR calc non Af Amer: >60 mL/min (ref >60)
Glucose, Bld: 102 mg/dL — ABNORMAL HIGH (ref 65–99)
Potassium: 4.2 mmol/L (ref 3.5–5.1)
Sodium: 137 mmol/L (ref 135–145)
TOTAL PROTEIN: 7.8 g/dL (ref 6.5–8.1)
Total Bilirubin: 0.6 mg/dL (ref 0.3–1.2)

## 2016-03-22 LAB — CBC
HCT: 43 % (ref 35.0–47.0)
Hemoglobin: 14.8 g/dL (ref 12.0–16.0)
MCH: 28.9 pg (ref 26.0–34.0)
MCHC: 34.3 g/dL (ref 32.0–36.0)
MCV: 84 fL (ref 80.0–100.0)
PLATELETS: 360 10*3/uL (ref 150–440)
RBC: 5.12 MIL/uL (ref 3.80–5.20)
RDW: 13.1 % (ref 11.5–14.5)
WBC: 8.1 10*3/uL (ref 3.6–11.0)

## 2016-03-22 LAB — POCT PREGNANCY, URINE: PREG TEST UR: NEGATIVE

## 2016-03-22 LAB — URINE DRUG SCREEN, QUALITATIVE (ARMC ONLY)
Amphetamines, Ur Screen: NOT DETECTED
BENZODIAZEPINE, UR SCRN: NOT DETECTED
Barbiturates, Ur Screen: NOT DETECTED
CANNABINOID 50 NG, UR ~~LOC~~: NOT DETECTED
Cocaine Metabolite,Ur ~~LOC~~: NOT DETECTED
MDMA (ECSTASY) UR SCREEN: NOT DETECTED
Methadone Scn, Ur: NOT DETECTED
Opiate, Ur Screen: NOT DETECTED
PHENCYCLIDINE (PCP) UR S: NOT DETECTED
Tricyclic, Ur Screen: NOT DETECTED

## 2016-03-22 LAB — ACETAMINOPHEN LEVEL: Acetaminophen (Tylenol), Serum: <10 ug/mL — ABNORMAL LOW (ref 10–30)

## 2016-03-22 LAB — ETHANOL

## 2016-03-22 LAB — SALICYLATE LEVEL: Salicylate Lvl: <4 mg/dL (ref 2.8–30.0)

## 2016-03-22 MED ORDER — CITALOPRAM HYDROBROMIDE 20 MG PO TABS: 20.0000 mg | ORAL_TABLET | Freq: Every day | ORAL | 1 refills | Status: DC

## 2016-03-22 MED ORDER — TRAZODONE HCL 100 MG PO TABS: 100.0000 mg | ORAL_TABLET | Freq: Every evening | ORAL | 1 refills | Status: DC | PRN

## 2016-03-22 NOTE — Consult Note (Signed)
[facility] Face-to-Face Psychiatry Consult   Reason for Consult:  Consult from 20 year old woman who presented voluntarily to the emergency room for evaluation of depression Referring Physician:  Karma Greaser Patient Identification: Jordan David MRN:  [id number] Principal Diagnosis: Depression, major, recurrent, moderate ([place]) Diagnosis:   Patient Active Problem List   Diagnosis Date Noted  . Depression, major, recurrent, moderate ([place]) [F33.1] 03/22/2016  . Insomnia [G47.00] 03/22/2016    Total Time spent with patient: 1 hour  Subjective:   Jordan David is a 20 y.o. female patient admitted with "My Therapist and They Told Me to Come over Here".  HPI:  Patient is interviewed. Chart reviewed. Case reviewed with the emergency room physician and nursing. 20 year old woman came voluntarily to the emergency room. He went to see a therapist at [employer] today for the first time. She revealed to that person that she has occasional suicidal thoughts and asked her to come to the emergency room. Patient states that her mood has been depressed probably since April of this year when her aunt died. He feels down and sad much of the time. Despite that she is still continuing with her studies in school. He has a lot of trouble sleeping at night and so she often is up until 6:00 in the morning. She denies any psychotic symptoms. She does admit that she has occasionally had thoughts of suicide because her mind. Usually this happens when she is under some acute emotional stress. She has not had any actual intent to act on it. Patient was very appropriate in seeking psychiatric treatment as she did today. She denies that she is drinking abusing any drugs. Not currently on any psychiatric medicine.  Social history: Patient lives with some friends in a house and has support from her immediate family who live around here. He is not currently working because of chronic back pain but she is going to  school.  Medical history: Has a history of scoliosis and chronic back pain.  Substance abuse history: Denies alcohol or drug abuse.  Past Psychiatric History: Patient says she was treated for depression several years ago when she was an adolescent. She remembers being prescribed antidepressant medicine but cannot remember what it was. He was never admitted to the hospital and has never tried to kill herself. No history of violence. No history reported of psychosis.  Risk to Self: Is patient at risk for suicide?: No Risk to Others:   Prior Inpatient Therapy:   Prior Outpatient Therapy:    Past Medical History:  Past Medical History:  Diagnosis Date  . Allergy   . Anxiety    controlled with medication  . Asthma   . Back pain    scoliosis and dislocated disc (>1 year of pain)  . Depression    controlled with medication  . Stomach ulcer 2015   No past surgical history on file. Family History: No family history on file. Family Psychiatric  History: Patient reports her mother also has some anxiety or depression problems. No known suicide in the family. Social History:  History  Alcohol Use No     History  Drug use: Unknown    Social History   Social History  . Marital status: Single    Spouse name: N/A  . Number of children: N/A  . Years of education: N/A   Social History Main Topics  . Smoking status: Never Smoker  . Smokeless tobacco: None  . Alcohol use No  . Drug use: Unknown  .  Sexual activity: Not Asked   Other Topics Concern  . None   Social History Narrative  . None   Additional Social History:    Allergies:  No Known Allergies  Labs:  Results for orders placed or performed during the hospital encounter of 03/22/16 (from the past 48 hour(s))  Comprehensive metabolic panel     Status: Abnormal   Collection Time: 03/22/16  2:48 PM  Result Value Ref Range   Sodium 137 135 - 145 mmol/L   Potassium 4.2 3.5 - 5.1 mmol/L   Chloride 105 101 - 111 mmol/L    CO2 26 22 - 32 mmol/L   Glucose, Bld 102 (H) 65 - 99 mg/dL   BUN 8 6 - 20 mg/dL   Creatinine, Ser 0.71 0.44 - 1.00 mg/dL   Calcium 9.3 8.9 - 10.3 mg/dL   Total Protein 7.8 6.5 - 8.1 g/dL   Albumin 4.1 3.5 - 5.0 g/dL   AST 19 15 - 41 U/L   ALT 25 14 - 54 U/L   Alkaline Phosphatase 52 38 - 126 U/L   Total Bilirubin 0.6 0.3 - 1.2 mg/dL   GFR calc non Af Amer >60 >60 mL/min   GFR calc Af Amer >60 >60 mL/min    Comment: (NOTE) The eGFR has been calculated using the CKD EPI equation. This calculation has not been validated in all clinical situations. eGFR's persistently <60 mL/min signify possible Chronic Kidney Disease.    Anion gap 6 5 - 15  Ethanol     Status: None   Collection Time: 03/22/16  2:48 PM  Result Value Ref Range   Alcohol, Ethyl (B) <5 <5 mg/dL    Comment:        LOWEST DETECTABLE LIMIT FOR SERUM ALCOHOL IS 5 mg/dL FOR MEDICAL PURPOSES ONLY   Salicylate level     Status: None   Collection Time: 03/22/16  2:48 PM  Result Value Ref Range   Salicylate Lvl <9.7 2.8 - 30.0 mg/dL  Acetaminophen level     Status: Abnormal   Collection Time: 03/22/16  2:48 PM  Result Value Ref Range   Acetaminophen (Tylenol), Serum <10 (L) 10 - 30 ug/mL    Comment:        THERAPEUTIC CONCENTRATIONS VARY SIGNIFICANTLY. A RANGE OF 10-30 ug/mL MAY BE AN EFFECTIVE CONCENTRATION FOR MANY PATIENTS. HOWEVER, SOME ARE BEST TREATED AT CONCENTRATIONS OUTSIDE THIS RANGE. ACETAMINOPHEN CONCENTRATIONS >150 ug/mL AT 4 HOURS AFTER INGESTION AND >50 ug/mL AT 12 HOURS AFTER INGESTION ARE OFTEN ASSOCIATED WITH TOXIC REACTIONS.   cbc     Status: None   Collection Time: 03/22/16  2:48 PM  Result Value Ref Range   WBC 8.1 3.6 - 11.0 K/uL   RBC 5.12 3.80 - 5.20 MIL/uL   Hemoglobin 14.8 12.0 - 16.0 g/dL   HCT 43.0 35.0 - 47.0 %   MCV 84.0 80.0 - 100.0 fL   MCH 28.9 26.0 - 34.0 pg   MCHC 34.3 32.0 - 36.0 g/dL   RDW 13.1 11.5 - 14.5 %   Platelets 360 150 - 440 K/uL  Pregnancy, urine POC      Status: None   Collection Time: 03/22/16  4:33 PM  Result Value Ref Range   Preg Test, Ur NEGATIVE NEGATIVE    Comment:        THE SENSITIVITY OF THIS METHODOLOGY IS >24 mIU/mL     No current facility-administered medications for this encounter.    Current Outpatient Prescriptions  Medication Sig  Dispense Refill  . citalopram (CELEXA) 20 MG tablet Take 1 tablet (20 mg total) by mouth daily. 30 tablet 1  . famotidine (PEPCID) 20 MG tablet Take 1 tablet (20 mg total) by mouth 2 (two) times daily. 30 tablet 0  . loperamide (IMODIUM A-D) 2 MG tablet Take 1 tablet (2 mg total) by mouth 4 (four) times daily as needed for diarrhea or loose stools. 30 tablet 0  . ondansetron (ZOFRAN ODT) 4 MG disintegrating tablet Take 1 tablet (4 mg total) by mouth every 8 (eight) hours as needed for nausea or vomiting. 20 tablet 0  . traZODone (DESYREL) 100 MG tablet Take 1 tablet (100 mg total) by mouth at bedtime as needed for sleep. 30 tablet 1    Musculoskeletal: Strength & Muscle Tone: within normal limits Gait & Station: normal Patient leans: N/A  Psychiatric Specialty Exam: Physical Exam  Nursing note and vitals reviewed. Constitutional: She appears well-developed and well-nourished.  HENT:  Head: Normocephalic and atraumatic.  Eyes: Conjunctivae are normal. Pupils are equal, round, and reactive to light.  Neck: Normal range of motion.  Cardiovascular: Regular rhythm and normal heart sounds.   Respiratory: Effort normal. No respiratory distress.  GI: Soft.  Musculoskeletal: Normal range of motion.  Neurological: She is alert.  Skin: Skin is warm and dry.  Psychiatric: Her speech is normal. Judgment normal. Her mood appears anxious. She is slowed. Cognition and memory are normal. She exhibits a depressed mood. She expresses suicidal ideation. She expresses no suicidal plans.    Review of Systems  Constitutional: Negative.   HENT: Negative.   Eyes: Negative.   Respiratory: Negative.    Cardiovascular: Negative.   Gastrointestinal: Negative.   Musculoskeletal: Negative.   Skin: Negative.   Neurological: Negative.   Psychiatric/Behavioral: Positive for depression and suicidal ideas. Negative for hallucinations, memory loss and substance abuse. The patient is nervous/anxious and has insomnia.     Blood pressure 130/82, pulse 86, temperature 98 F (36.7 C), temperature source Oral, resp. rate 20, height '5\' 3"'$  (1.6 m), weight 117.9 kg (260 lb), last menstrual period 03/22/2016, SpO2 100 %.Body mass index is 46.06 kg/m.  General Appearance: Casual  Eye Contact:  Fair  Speech:  Clear and Coherent  Volume:  Decreased  Mood:  Anxious and Depressed  Affect:  Congruent  Thought Process:  Goal Directed  Orientation:  Full (Time, Place, and Person)  Thought Content:  Logical  Suicidal Thoughts:  Yes.  without intent/plan  Homicidal Thoughts:  No  Memory:  Immediate;   Good Recent;   Fair Remote;   Fair  Judgement:  Fair  Insight:  Fair  Psychomotor Activity:  Normal  Concentration:  Concentration: Fair  Recall:  [employer] of Knowledge:  Fair  Language:  Fair  Akathisia:  No  Handed:  Right  AIMS (if indicated):     Assets:  Communication Skills Desire for Improvement Housing Resilience Social Support  ADL's:  Intact  Cognition:  WNL  Sleep:        Treatment Plan Summary: Medication management and Plan 20 year old woman who has a moderate major depression. Suicidal thoughts without any intent or plan to act on it. Has positive coping strategies in place. Has positive plans for the future. Patient does not appear to meet commitment criteria nor to require admission to the inpatient unit. She is very willing to pursue appropriate outpatient treatment. Patient would like to consider antidepressant medication. We talked about some of the risks of this particularly  as I will not be available for follow-up but agreed to go ahead and give her a prescription for  citalopram 20 mg per day for depression. Also trazodone 100 mg at night as needed for sleep. Patient agreeable to the plan. Case reviewed with the emergency room physician. Patient can be released from the emergency room and will follow up at her current mental health provider  Disposition: Patient does not meet criteria for psychiatric inpatient admission.  Alethia Berthold, MD 03/22/2016 5:04 PM

## 2016-03-22 NOTE — ED Notes (Signed)
PT  VOL  PENDING  D/C ?

## 2016-03-22 NOTE — ED Provider Notes (Signed)
[facility] Emergency Department Provider Note  ____________________________________________   First MD Initiated Contact with Patient 03/22/16 1513     (approximate)  I have reviewed the triage vital signs and the nursing notes.   HISTORY  Chief Complaint Medical Clearance    HPI Jordan David is a 20 y.o. female with a history that includes anxiety and depression but he does not take any psychiatric medications who presents at the suggestion of her outpatient therapist for evaluation of worsening depression and possible suicidal ideation.  She reports that she has struggled with depression for years but that it has been steadily and gradually worsening for about the last 5 months since her aunt passed away.  She is also having numerous social stressors, issues with job and family, etc., that is exacerbating her frustration and depression.  She reports that she has at times thought about what it would be like to no longer be here in dealing with her issues.  Her visit with her therapist today was the first time with this therapist, and she reports that the therapist ask if the patient had a plan, if she wanted to kill herself, and the patient replied that she would take a bottle of pills.  She states that this that has occurred to her several times that she has absent no intention of dying or killing herself and that "if it came to that" she would give all her pills to someone else and asked for help before she did anything to injure herself.  He denies any acute medical complaints or concerns as listed below in the review of systems.  Past Medical History:  Diagnosis Date  . Allergy   . Anxiety    controlled with medication  . Asthma   . Back pain    scoliosis and dislocated disc (>1 year of pain)  . Depression    controlled with medication  . Stomach ulcer 2015    Patient Active Problem List   Diagnosis Date Noted  . Depression, major, recurrent,  moderate (HCC) 03/22/2016  . Insomnia 03/22/2016    No past surgical history on file.  Prior to Admission medications   Medication Sig Start Date End Date Taking? Authorizing Provider  citalopram (CELEXA) 20 MG tablet Take 1 tablet (20 mg total) by mouth daily. 03/22/16   Audery Amel, MD  famotidine (PEPCID) 20 MG tablet Take 1 tablet (20 mg total) by mouth 2 (two) times daily. 03/24/15   Irean Hong, MD  loperamide (IMODIUM A-D) 2 MG tablet Take 1 tablet (2 mg total) by mouth 4 (four) times daily as needed for diarrhea or loose stools. 02/03/15   Emily Filbert, MD  ondansetron (ZOFRAN ODT) 4 MG disintegrating tablet Take 1 tablet (4 mg total) by mouth every 8 (eight) hours as needed for nausea or vomiting. 02/03/15   Emily Filbert, MD  traZODone (DESYREL) 100 MG tablet Take 1 tablet (100 mg total) by mouth at bedtime as needed for sleep. 03/22/16   Audery Amel, MD    Allergies Review of patient's allergies indicates no known allergies.  No family history on file.  Social History Social History  Substance Use Topics  . Smoking status: Never Smoker  . Smokeless tobacco: Not on file  . Alcohol use No    Review of Systems Constitutional: No fever/chills Eyes: No visual changes. ENT: No sore throat. Cardiovascular: Denies chest pain. Respiratory: Denies shortness of breath. Gastrointestinal: No abdominal pain.  No nausea, no vomiting.  No diarrhea.  No constipation. Genitourinary: Negative for dysuria. Musculoskeletal: Negative for back pain. Skin: Negative for rash. Neurological: Negative for headaches, focal weakness or numbness.  10-point ROS otherwise negative.  ____________________________________________   PHYSICAL EXAM:  VITAL SIGNS: ED Triage Vitals [03/22/16 1445]  Enc Vitals Group     BP 130/82     Pulse Rate 86     Resp 20     Temp 98 F (36.7 C)     Temp Source Oral     SpO2 100 %     Weight 260 lb (117.9 kg)     Height 5\' 3"  (1.6 m)      Head Circumference      Peak Flow      Pain Score      Pain Loc      Pain Edu?      Excl. in GC?     Constitutional: Alert and oriented. Well appearing and in no acute distress. Eyes: Conjunctivae are normal. PERRL. EOMI. Head: Atraumatic. Nose: No congestion/rhinnorhea. Mouth/Throat: Mucous membranes are moist.  Oropharynx non-erythematous. Neck: No stridor.  No meningeal signs.   Cardiovascular: Normal rate, regular rhythm. Good peripheral circulation. Grossly normal heart sounds. Respiratory: Normal respiratory effort.  No retractions. Lungs CTAB. Gastrointestinal: Soft and nontender. No distention.  Musculoskeletal: No lower extremity tenderness nor edema. No gross deformities of extremities. Neurologic:  Normal speech and language. No gross focal neurologic deficits are appreciated.  Skin:  Skin is warm, dry and intact. No rash noted. Psychiatric: Mood and affect are somewhat depression but generally normal.  Denies active SI/HI but does admit to occasionally having thoughts about "not being here" and admits that she has considered overdosing on "medications" but claims that she would never actually do so.  ____________________________________________   LABS (all labs ordered are listed, but only abnormal results are displayed)  Labs Reviewed  COMPREHENSIVE METABOLIC PANEL - Abnormal; Notable for the following:       Result Value   Glucose, Bld 102 (*)    All other components within normal limits  ACETAMINOPHEN LEVEL - Abnormal; Notable for the following:    Acetaminophen (Tylenol), Serum <10 (*)    All other components within normal limits  ETHANOL  SALICYLATE LEVEL  CBC  URINE DRUG SCREEN, QUALITATIVE ([facility] ONLY)  POCT PREGNANCY, URINE   ____________________________________________  EKG  None - EKG not ordered by ED physician ____________________________________________  RADIOLOGY   No results  found.  ____________________________________________   PROCEDURES  Procedure(s) performed:   Procedures   Critical Care performed: No ____________________________________________   INITIAL IMPRESSION / ASSESSMENT AND PLAN / ED COURSE  Pertinent labs & imaging results that were available during my care of the patient were reviewed by me and considered in my medical decision making (see chart for details).  Although the patient has been dealing with gradually worsening depression and has had a number of social stressors recently, she contracts for safety and is not an acute threat to herself, and my opinion after my 1 assessment.  She was sent by her therapist and I believe she would benefit from consultation with our psychiatrist to see if he agrees that she would be appropriate for outpatient treatment and see if he has any pharmacological recommendations.  She and I agreed that she would remain voluntary at this time but I will reassess this plan if she tries to leave suddenly.   Clinical Course  Comment By Time  I spoke  in person with Dr. Toni Amend who evaluated the patient and feels that the patient is safe to be discharged with outpatient follow-up.  The patient is hemodynamically stable and appropriate to go at this time.  Loleta Rose, MD 09/25 1704    ____________________________________________  FINAL CLINICAL IMPRESSION(S) / ED DIAGNOSES  Final diagnoses:  Depression     MEDICATIONS GIVEN DURING THIS VISIT:  Medications - No data to display   NEW OUTPATIENT MEDICATIONS STARTED DURING THIS VISIT:  Current Discharge Medication List    START taking these medications   Details  citalopram (CELEXA) 20 MG tablet Take 1 tablet (20 mg total) by mouth daily. Qty: 30 tablet, Refills: 1    traZODone (DESYREL) 100 MG tablet Take 1 tablet (100 mg total) by mouth at bedtime as needed for sleep. Qty: 30 tablet, Refills: 1        Current Discharge Medication List       Current Discharge Medication List       Note:  This document was prepared using Dragon voice recognition software and may include unintentional dictation errors.    Loleta Rose, MD 03/22/16 [phone]

## 2016-03-22 NOTE — Discharge Instructions (Signed)
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
## 2016-03-22 NOTE — ED Triage Notes (Addendum)
Pt sent by therapist , pt has been depressed since Aunt died in April , pt denies SI/HI during triage, pt verbalizes thoughts of "wanting to hurt herself by taking a handfull of pills", pt reports feeling hopeless

## 2016-10-23 ENCOUNTER — Encounter: Payer: Self-pay | Admitting: Emergency Medicine

## 2016-10-23 ENCOUNTER — Emergency Department
Admission: EM | Admit: 2016-10-23 | Discharge: 2016-10-23 | Disposition: A | Discharge status: Home / Self Care | Payer: No Typology Code available for payment source | Attending: Emergency Medicine | Admitting: Emergency Medicine

## 2016-10-23 ENCOUNTER — Emergency Department: Payer: No Typology Code available for payment source

## 2016-10-23 DIAGNOSIS — M545 Low back pain, unspecified: Secondary | ICD-10-CM

## 2016-10-23 DIAGNOSIS — M5441 Lumbago with sciatica, right side: Secondary | ICD-10-CM | POA: Diagnosis not present

## 2016-10-23 DIAGNOSIS — J45909 Unspecified asthma, uncomplicated: Secondary | ICD-10-CM | POA: Insufficient documentation

## 2016-10-23 DIAGNOSIS — M79604 Pain in right leg: Secondary | ICD-10-CM | POA: Diagnosis not present

## 2016-10-23 DIAGNOSIS — Y999 Unspecified external cause status: Secondary | ICD-10-CM | POA: Insufficient documentation

## 2016-10-23 DIAGNOSIS — Z79899 Other long term (current) drug therapy: Secondary | ICD-10-CM | POA: Insufficient documentation

## 2016-10-23 DIAGNOSIS — Y9389 Activity, other specified: Secondary | ICD-10-CM | POA: Insufficient documentation

## 2016-10-23 DIAGNOSIS — M542 Cervicalgia: Secondary | ICD-10-CM | POA: Diagnosis not present

## 2016-10-23 DIAGNOSIS — Y92410 Unspecified street and highway as the place of occurrence of the external cause: Secondary | ICD-10-CM | POA: Insufficient documentation

## 2016-10-23 DIAGNOSIS — S3992XA Unspecified injury of lower back, initial encounter: Secondary | ICD-10-CM | POA: Diagnosis present

## 2016-10-23 HISTORY — DX: Unspecified thoracic, thoracolumbar and lumbosacral intervertebral disc disorder: M51.9

## 2016-10-23 LAB — POCT PREGNANCY, URINE: PREG TEST UR: NEGATIVE

## 2016-10-23 MED ORDER — IBUPROFEN 800 MG PO TABS
800.0000 mg | ORAL_TABLET | Freq: Once | ORAL | Status: DC
  Filled 2016-10-23: qty 1

## 2016-10-23 MED ORDER — METHOCARBAMOL 500 MG PO TABS: 500.0000 mg | ORAL_TABLET | Freq: Once | ORAL | Status: DC

## 2016-10-23 MED ORDER — METHOCARBAMOL 500 MG PO TABS
ORAL_TABLET | ORAL | Status: AC
  Filled 2016-10-23: qty 1

## 2016-10-23 MED ORDER — METHOCARBAMOL 500 MG PO TABS: 500.0000 mg | ORAL_TABLET | Freq: Three times a day (TID) | ORAL | 0 refills | Status: DC

## 2016-10-23 MED ORDER — CYCLOBENZAPRINE HCL 5 MG PO TABS: 5.0000 mg | ORAL_TABLET | Freq: Three times a day (TID) | ORAL | 0 refills | Status: DC | PRN

## 2016-10-23 MED ORDER — ACETAMINOPHEN 500 MG PO TABS
ORAL_TABLET | ORAL | Status: AC
Start: 2016-10-23 — End: 2016-10-23
  Administered 2016-10-23: 1000 mg via ORAL
  Filled 2016-10-23: qty 2

## 2016-10-23 MED ORDER — ACETAMINOPHEN 500 MG PO TABS
1000.0000 mg | ORAL_TABLET | Freq: Once | ORAL | Status: AC
  Administered 2016-10-23: 1000 mg via ORAL
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
## 2016-11-23 ENCOUNTER — Ambulatory Visit (INDEPENDENT_AMBULATORY_CARE_PROVIDER_SITE_OTHER): Payer: Medicaid Other | Admitting: Podiatry

## 2016-11-23 ENCOUNTER — Ambulatory Visit (INDEPENDENT_AMBULATORY_CARE_PROVIDER_SITE_OTHER): Payer: Medicaid Other

## 2016-11-23 DIAGNOSIS — M79671 Pain in right foot: Secondary | ICD-10-CM

## 2016-11-23 DIAGNOSIS — M79672 Pain in left foot: Principal | ICD-10-CM

## 2016-11-23 DIAGNOSIS — M722 Plantar fascial fibromatosis: Secondary | ICD-10-CM

## 2016-11-23 MED ORDER — MELOXICAM 15 MG PO TABS: 15.0000 mg | ORAL_TABLET | Freq: Every day | ORAL | 1 refills | Status: AC

## 2016-11-25 NOTE — Progress Notes (Signed)
   Subjective: Patient presents today for pain and tenderness in the feet bilaterally. She also reports flatfeet bilaterally. She states her feet point outwards. She reports associated swelling of the right foot. Patient presents today for further treatment and evaluation  Objective: Physical Exam General: The patient is alert and oriented x3 in no acute distress.  Dermatology: Skin is warm, dry and supple bilateral lower extremities. Negative for open lesions or macerations bilateral.   Vascular: Dorsalis Pedis and Posterior Tibial pulses palpable bilateral.  Capillary fill time is immediate to all digits.  Neurological: Epicritic and protective threshold intact bilateral.   Musculoskeletal: Tenderness to palpation at the medial calcaneal tubercale and through the insertion of the plantar fascia of the bilateral feet. All other joints range of motion within normal limits bilateral. Strength 5/5 in all groups bilateral.   Radiographic exam: Normal osseous mineralization. Joint spaces preserved. No fracture/dislocation/boney destruction. Calcaneal spur present with mild thickening of plantar fascia bilateral. Arch collapse noted on lateral view. No other soft tissue abnormalities or radiopaque foreign bodies.   Assessment: #1 plantar fasciitis bilateral feet #2 pain in bilateral feet #3 flatfoot bilateral  Plan of Care:  1. Patient evaluated. Xrays reviewed.   2. Injection of 0.5cc Celestone soluspan injected into the bilateral heels.  3. Instructed patient regarding therapies and modalities at home to alleviate symptoms.  4. Rx for Meloxicam 15mg  PO given to patient.  5. Recommended new shoes from [employer]. 6. Return to clinic in 4 weeks.   Felecia ShellingBrent M. Yomayra Tate, DPM [facility]  Dr. Felecia ShellingBrent M. Mirha Brucato, DPM    [address]                                        [place], [place] [address]                Office [phone]  Fax [phone]

## 2016-12-13 MED ORDER — BETAMETHASONE SOD PHOS & ACET 6 (3-3) MG/ML IJ SUSP: 3.0000 mg | Freq: Once | INTRAMUSCULAR | Status: DC

## 2016-12-21 ENCOUNTER — Ambulatory Visit: Payer: Medicaid Other | Admitting: Podiatry

## 2017-05-10 ENCOUNTER — Encounter: Payer: Self-pay | Admitting: Anesthesiology

## 2017-05-10 ENCOUNTER — Ambulatory Visit: Payer: Medicaid Other | Admitting: Anesthesiology

## 2017-05-10 ENCOUNTER — Ambulatory Visit
Admission: RE | Admit: 2017-05-10 | Discharge: 2017-05-10 | Disposition: A | Discharge status: Home / Self Care | Payer: Medicaid Other | Source: Ambulatory Visit | Attending: Internal Medicine | Admitting: Internal Medicine

## 2017-05-10 ENCOUNTER — Encounter
Admission: RE | Disposition: A | Discharge status: Home / Self Care | Payer: Self-pay | Source: Ambulatory Visit | Attending: Internal Medicine

## 2017-05-10 DIAGNOSIS — Z955 Presence of coronary angioplasty implant and graft: Secondary | ICD-10-CM | POA: Insufficient documentation

## 2017-05-10 DIAGNOSIS — J45909 Unspecified asthma, uncomplicated: Secondary | ICD-10-CM | POA: Diagnosis not present

## 2017-05-10 DIAGNOSIS — R011 Cardiac murmur, unspecified: Secondary | ICD-10-CM | POA: Insufficient documentation

## 2017-05-10 DIAGNOSIS — K580 Irritable bowel syndrome with diarrhea: Secondary | ICD-10-CM | POA: Diagnosis present

## 2017-05-10 DIAGNOSIS — M5186 Other intervertebral disc disorders, lumbar region: Secondary | ICD-10-CM | POA: Diagnosis not present

## 2017-05-10 DIAGNOSIS — Z6841 Body Mass Index (BMI) 40.0 and over, adult: Secondary | ICD-10-CM | POA: Insufficient documentation

## 2017-05-10 DIAGNOSIS — Z8711 Personal history of peptic ulcer disease: Secondary | ICD-10-CM | POA: Diagnosis not present

## 2017-05-10 DIAGNOSIS — M549 Dorsalgia, unspecified: Secondary | ICD-10-CM | POA: Insufficient documentation

## 2017-05-10 DIAGNOSIS — I2510 Atherosclerotic heart disease of native coronary artery without angina pectoris: Secondary | ICD-10-CM | POA: Insufficient documentation

## 2017-05-10 DIAGNOSIS — M419 Scoliosis, unspecified: Secondary | ICD-10-CM | POA: Insufficient documentation

## 2017-05-10 DIAGNOSIS — R1032 Left lower quadrant pain: Secondary | ICD-10-CM | POA: Insufficient documentation

## 2017-05-10 DIAGNOSIS — F329 Major depressive disorder, single episode, unspecified: Secondary | ICD-10-CM | POA: Diagnosis not present

## 2017-05-10 DIAGNOSIS — R197 Diarrhea, unspecified: Secondary | ICD-10-CM | POA: Insufficient documentation

## 2017-05-10 DIAGNOSIS — I10 Essential (primary) hypertension: Secondary | ICD-10-CM | POA: Diagnosis not present

## 2017-05-10 DIAGNOSIS — R1012 Left upper quadrant pain: Secondary | ICD-10-CM | POA: Diagnosis not present

## 2017-05-10 DIAGNOSIS — E669 Obesity, unspecified: Secondary | ICD-10-CM | POA: Insufficient documentation

## 2017-05-10 DIAGNOSIS — Z79899 Other long term (current) drug therapy: Secondary | ICD-10-CM | POA: Insufficient documentation

## 2017-05-10 DIAGNOSIS — I252 Old myocardial infarction: Secondary | ICD-10-CM | POA: Insufficient documentation

## 2017-05-10 DIAGNOSIS — F419 Anxiety disorder, unspecified: Secondary | ICD-10-CM | POA: Insufficient documentation

## 2017-05-10 HISTORY — PX: FLEXIBLE SIGMOIDOSCOPY: SHX5431

## 2017-05-10 HISTORY — PX: ESOPHAGOGASTRODUODENOSCOPY (EGD) WITH PROPOFOL: SHX5813

## 2017-05-10 SURGERY — ESOPHAGOGASTRODUODENOSCOPY (EGD) WITH PROPOFOL
Anesthesia: General

## 2017-05-10 MED ORDER — SODIUM CHLORIDE 0.9 % IV SOLN
INTRAVENOUS | Status: DC
  Administered 2017-05-10: 14:00:00 via INTRAVENOUS

## 2017-05-10 MED ORDER — MIDAZOLAM HCL 2 MG/2ML IJ SOLN
INTRAMUSCULAR | Status: AC
  Filled 2017-05-10: qty 2

## 2017-05-10 MED ORDER — FENTANYL CITRATE (PF) 100 MCG/2ML IJ SOLN
INTRAMUSCULAR | Status: DC | PRN
  Administered 2017-05-10: 50 ug via INTRAVENOUS

## 2017-05-10 MED ORDER — PROPOFOL 500 MG/50ML IV EMUL
INTRAVENOUS | Status: DC | PRN
  Administered 2017-05-10: 30 ug/kg/min via INTRAVENOUS
  Administered 2017-05-10: 150 ug/kg/min via INTRAVENOUS

## 2017-05-10 MED ORDER — LIDOCAINE HCL (CARDIAC) 20 MG/ML IV SOLN
INTRAVENOUS | Status: DC | PRN
  Administered 2017-05-10: 100 mg via INTRAVENOUS

## 2017-05-10 MED ORDER — PROPOFOL 10 MG/ML IV BOLUS
INTRAVENOUS | Status: AC
  Filled 2017-05-10: qty 20

## 2017-05-10 MED ORDER — FENTANYL CITRATE (PF) 100 MCG/2ML IJ SOLN
INTRAMUSCULAR | Status: AC
  Filled 2017-05-10: qty 2

## 2017-05-10 MED ORDER — PROPOFOL 10 MG/ML IV BOLUS
INTRAVENOUS | Status: DC | PRN
  Administered 2017-05-10: 30 mg via INTRAVENOUS

## 2017-05-10 MED ORDER — MIDAZOLAM HCL 2 MG/2ML IJ SOLN
INTRAMUSCULAR | Status: DC | PRN
  Administered 2017-05-10: 1 mg via INTRAVENOUS

## 2017-05-10 NOTE — Transfer of Care (Signed)
Immediate Anesthesia Transfer of Care Note  Patient: Jordan David  Procedure(s) Performed: ESOPHAGOGASTRODUODENOSCOPY (EGD) WITH PROPOFOL (N/A ) FLEXIBLE SIGMOIDOSCOPY (N/A )  Patient Location: PACU  Anesthesia Type:General  Level of Consciousness: awake  Airway & Oxygen Therapy: Patient Spontanous Breathing and Patient connected to nasal cannula oxygen  Post-op Assessment: Report given to RN and Post -op Vital signs reviewed and stable  Post vital signs: Reviewed and stable  Last Vitals:  Vitals:   05/10/17 1442 05/10/17 1443  BP:  128/71  Pulse:  97  Resp:  18  Temp: 36.4 C 36.4 C  SpO2:  100%    Last Pain:  Vitals:   05/10/17 1443  TempSrc: Tympanic         Complications: No apparent anesthesia complications

## 2017-05-10 NOTE — Anesthesia Postprocedure Evaluation (Signed)
Anesthesia Post Note  Patient: Hilliary N Shane  Procedure(s) Performed: ESOPHAGOGASTRODUODENOSCOPY (EGD) WITH PROPOFOL (N/A ) FLEXIBLE SIGMOIDOSCOPY (N/A )  Patient location during evaluation: Endoscopy Anesthesia Type: General Level of consciousness: awake and alert and oriented Pain management: pain level controlled Vital Signs Assessment: post-procedure vital signs reviewed and stable Respiratory status: spontaneous breathing, nonlabored ventilation and respiratory function stable Cardiovascular status: blood pressure returned to baseline and stable Postop Assessment: no signs of nausea or vomiting Anesthetic complications: no     Last Vitals:  Vitals:   05/10/17 1442 05/10/17 1443  BP:  128/71  Pulse:  97  Resp:  18  Temp: 36.4 C 36.4 C  SpO2:  100%    Last Pain:  Vitals:   05/10/17 1443  TempSrc: Tympanic                 Pamelia Botto

## 2017-05-10 NOTE — Interval H&P Note (Signed)
History and Physical Interval Note:  05/10/2017 1:53 PM  Jordan David  has presented today for surgery, with the diagnosis of IBS WITH DIARRHEA PERIUMBILICAL ABDOMINAL PAIN  The various methods of treatment have been discussed with the patient and family. After consideration of risks, benefits and other options for treatment, the patient has consented to  Procedure(s): ESOPHAGOGASTRODUODENOSCOPY (EGD) WITH PROPOFOL (N/A) FLEXIBLE SIGMOIDOSCOPY (N/A) as a surgical intervention .  The patient's history has been reviewed, patient examined, no change in status, stable for surgery.  I have reviewed the patient's chart and labs.  Questions were answered to the patient's satisfaction.     [name], [name]

## 2017-05-10 NOTE — Op Note (Signed)
[facility] Gastroenterology Patient Name: Jordan KlinefelterDestiny Dinovo Procedure Date: 05/10/2017 2:02 PM MRN: [id number] Account #: [id number] Date of Birth: 02-06-1996 Admit Type: Outpatient Age: [age] [facility] Gender: Female Note Status: Finalized Procedure:            Flexible Sigmoidoscopy Indications:          Abdominal pain in the left lower quadrant, Diarrhea Providers:            Boykin Nearingeodoro K. Norma Fredricksonoledo MD, MD Referring MD:         Dorcas McmurrayJessica A. Rubio (Referring MD) Medicines:            Propofol per Anesthesia Complications:        No immediate complications. Procedure:            Pre-Anesthesia Assessment:                       - The risks and benefits of the procedure and the                        sedation options and risks were discussed with the                        patient. All questions were answered and informed                        consent was obtained.                       - Patient identification and proposed procedure were                        verified prior to the procedure by the nurse. The                        procedure was verified in the procedure room.                       - The risks and benefits of the procedure and the                        sedation options and risks were discussed with the                        patient. All questions were answered and informed                        consent was obtained.                       - Patient identification and proposed procedure were                        verified prior to the procedure by the nurse. The                        procedure was verified in the procedure room.                       - ASA Grade Assessment: III - A patient with severe  systemic disease.                       After obtaining informed consent, the scope was passed                        under direct vision. The Colonoscope was introduced                        through the anus and  advanced to the the descending                        colon. The flexible sigmoidoscopy was accomplished                        without difficulty. The patient tolerated the procedure                        well. The quality of the bowel preparation was adequate. Findings:      The perianal and digital rectal examinations were normal.      The colon (entire examined portion) appeared normal. Biopsies were taken       with a cold forceps for histology. Impression:           - The entire examined colon is normal. Biopsied. Recommendation:       - Continue present medications.                       - Discharge patient to home (with escort).                       - Await pathology results.                       - Return to GI clinic in 3 months. Procedure Code(s):    --- Professional ---                       [phone] [id number], Sigmoidoscopy, flexible; with biopsy, single or                        multiple Diagnosis Code(s):    --- Professional ---                       R19.7, Diarrhea, unspecified                       R10.32, Left lower quadrant pain CPT copyright 2016 American Medical Association. All rights reserved. The codes documented in this report are preliminary and upon coder review may  be revised to meet current compliance requirements. Stanton Kidneyeodoro K Capricia Serda MD, MD 05/10/2017 2:37:50 PM This report has been signed electronically. Number of Addenda: 0 Note Initiated On: 05/10/2017 2:02 PM      [facility]

## 2017-05-10 NOTE — Anesthesia Preprocedure Evaluation (Signed)
Anesthesia Evaluation  Patient identified by MRN, date of birth, ID band Patient awake    Reviewed: Allergy & Precautions, NPO status , Patient's Chart, lab work & pertinent test results  History of Anesthesia Complications Negative for: history of anesthetic complications  Airway Mallampati: III  TM Distance: >3 FB Neck ROM: Full    Dental no notable dental hx.    Pulmonary asthma , neg sleep apnea,    breath sounds clear to auscultation- rhonchi (-) wheezing      Cardiovascular Exercise Tolerance: Good (-) hypertension(-) CAD, (-) Past MI and (-) Cardiac Stents  Rhythm:Regular Rate:Normal - Systolic murmurs and - Diastolic murmurs    Neuro/Psych PSYCHIATRIC DISORDERS Anxiety Depression negative neurological ROS     GI/Hepatic Neg liver ROS, PUD,   Endo/Other  negative endocrine ROSneg diabetes  Renal/GU negative Renal ROS     Musculoskeletal negative musculoskeletal ROS (+)   Abdominal (+) + obese,   Peds  Hematology negative hematology ROS (+)   Anesthesia Other Findings Past Medical History: No date: Allergy No date: Anxiety     Comment:  controlled with medication No date: Asthma No date: Back pain     Comment:  scoliosis and dislocated disc (>1 year of pain) No date: Depression     Comment:  controlled with medication No date: Lumbar disc disorder 2015: Stomach ulcer   Reproductive/Obstetrics                             Anesthesia Physical Anesthesia Plan  ASA: II  Anesthesia Plan: General   Post-op Pain Management:    Induction: Intravenous  PONV Risk Score and Plan: 2 and Propofol infusion  Airway Management Planned: Natural Airway  Additional Equipment:   Intra-op Plan:   Post-operative Plan:   Informed Consent: I have reviewed the patients History and Physical, chart, labs and discussed the procedure including the risks, benefits and alternatives for the  proposed anesthesia with the patient or authorized representative who has indicated his/her understanding and acceptance.   Dental advisory given  Plan Discussed with: CRNA and Anesthesiologist  Anesthesia Plan Comments:         Anesthesia Quick Evaluation

## 2017-05-10 NOTE — H&P (Signed)
Outpatient short stay form Pre-procedure 05/10/2017 1:51 PM Jordan David, M.D.  Primary Physician: Pilar Grammeshomas Wroth, M.D.  Reason for visit: Abdominal pain, diarrhea, nausea  History of present illness:  21 y/o patient with personal hx of morbid obesity has chronic abdominal pain, chronic diarrhea. No hematemesis.    Current Facility-Administered Medications:  .  0.9 %  sodium chloride infusion, , Intravenous, Continuous, Toledo, Boykin Nearingeodoro K, MD  Facility-Administered Medications Prior to Admission  Medication Dose Route Frequency Provider Last Rate Last Dose  . betamethasone acetate-betamethasone sodium phosphate (CELESTONE) injection 3 mg  3 mg Intramuscular Once Felecia ShellingEvans, Brent M, DPM       Medications Prior to Admission  Medication Sig Dispense Refill Last Dose  . albuterol (PROVENTIL HFA;VENTOLIN HFA) 108 (90 Base) MCG/ACT inhaler Inhale as needed into the lungs for wheezing or shortness of breath.   Past Month at Unknown time  . buPROPion (WELLBUTRIN XL) 300 MG 24 hr tablet Take 300 mg by mouth every morning.  1 Past Week at Unknown time  . cholecalciferol (VITAMIN D) 400 units TABS tablet Take 800 Units by mouth.   Past Week at Unknown time  . mirtazapine (REMERON) 30 MG tablet Take 30 mg by mouth at bedtime.  1 Past Week at Unknown time  . phentermine (ADIPEX-P) 37.5 MG tablet Take 37.5 mg daily before breakfast by mouth.   Past Month at Unknown time  . sertraline (ZOLOFT) 100 MG tablet Take 100 mg daily by mouth.   Not Taking at Unknown time     No Known Allergies   Past Medical History:  Diagnosis Date  . Allergy   . Anxiety    controlled with medication  . Asthma   . Back pain    scoliosis and dislocated disc (>1 year of pain)  . Depression    controlled with medication  . Lumbar disc disorder   . Stomach ulcer 2015    Review of systems:      Physical Exam  General appearance: alert, cooperative and appears stated age Resp: clear to auscultation  bilaterally Cardio: regular rate and rhythm, S1, S2 normal, no murmur, click, rub or gallop GI: soft, non-tender; bowel sounds normal; no masses,  no organomegaly and Morbidly obese abdomen Extremities: extremities normal, atraumatic, no cyanosis or edema     Planned procedures: EGD and flexible sigmoidoscopy. The patient understands the nature of the planned procedure, indications, risks, alternatives and potential complications including but not limited to bleeding, infection, perforation, damage to internal organs and possible oversedation/side effects from anesthesia. The patient agrees and gives consent to proceed.  Please refer to procedure notes for findings, recommendations and patient disposition/instructions.    Jordan David, M.D. Gastroenterology 05/10/2017  1:51 PM

## 2017-05-10 NOTE — Anesthesia Post-op Follow-up Note (Signed)
Anesthesia QCDR form completed.        

## 2017-05-10 NOTE — Op Note (Signed)
[facility] Gastroenterology Patient Name: Jordan KlinefelterDestiny Rosencrans Procedure Date: 05/10/2017 2:02 PM MRN: [id number] Account #: [id number] Date of Birth: 1995-08-31 Admit Type: Outpatient Age: [age] [facility] Gender: Female Note Status: Finalized Procedure:            Upper GI endoscopy Indications:          Abdominal pain in the left upper quadrant Providers:            Boykin Nearingeodoro K. Norma Fredricksonoledo MD, MD Referring MD:         Dorcas McmurrayJessica A. Rubio (Referring MD) Medicines:            Propofol per Anesthesia Complications:        No immediate complications. Procedure:            Pre-Anesthesia Assessment:                       - The risks and benefits of the procedure and the                        sedation options and risks were discussed with the                        patient. All questions were answered and informed                        consent was obtained.                       - Patient identification and proposed procedure were                        verified prior to the procedure by the nurse. The                        procedure was verified in the procedure room.                       - ASA Grade Assessment: III - A patient with severe                        systemic disease.                       - After reviewing the risks and benefits, the patient                        was deemed in satisfactory condition to undergo the                        procedure.                       After obtaining informed consent, the endoscope was                        passed under direct vision. Throughout the procedure,                        the patient's blood pressure, pulse, and oxygen  saturations were monitored continuously. The Endoscope                        was introduced through the mouth, and advanced to the                        second part of duodenum. The upper GI endoscopy was                        accomplished without difficulty. The  patient tolerated                        the procedure well. Findings:      The examined esophagus was normal.      The entire examined stomach was normal. Biopsies were taken with a cold       forceps for Helicobacter pylori testing. Biopsies were taken with a cold       forceps for Helicobacter pylori testing.      The cardia and gastric fundus were normal on retroflexion.      The examined duodenum was normal. Impression:           - Normal esophagus.                       - Normal stomach. Biopsied.                       - Normal examined duodenum. Recommendation:       - Await pathology results.                       - See the other procedure note for documentation of                        additional recommendations. Procedure Code(s):    --- Professional ---                       [id number], Esophagogastroduodenoscopy, flexible, transoral;                        with biopsy, single or multiple Diagnosis Code(s):    --- Professional ---                       R10.12, Left upper quadrant pain CPT copyright 2016 American Medical Association. All rights reserved. The codes documented in this report are preliminary and upon coder review may  be revised to meet current compliance requirements. Stanton Kidneyeodoro K Hevin Jeffcoat MD, MD 05/10/2017 2:38:34 PM This report has been signed electronically. Number of Addenda: 0 Note Initiated On: 05/10/2017 2:02 PM Total Procedure Duration: 0 hours 3 minutes 0 seconds       [facility]

## 2017-05-10 NOTE — Anesthesia Procedure Notes (Signed)
Date/Time: 05/10/2017 2:20 PM Performed by: Tonia Ghentook-Martin, Ellouise Mcwhirter Pre-anesthesia Checklist: Patient identified, Emergency Drugs available, Suction available, Patient being monitored and Timeout performed Patient Re-evaluated:Patient Re-evaluated prior to induction Oxygen Delivery Method: Nasal cannula Preoxygenation: Pre-oxygenation with 100% oxygen Induction Type: IV induction Placement Confirmation: positive ETCO2 Dental Injury: Teeth and Oropharynx as per pre-operative assessment

## 2017-05-11 ENCOUNTER — Encounter: Payer: Self-pay | Admitting: Internal Medicine

## 2017-05-12 LAB — SURGICAL PATHOLOGY

## 2017-05-24 ENCOUNTER — Emergency Department
Admission: EM | Admit: 2017-05-24 | Discharge: 2017-05-24 | Disposition: A | Discharge status: Home / Self Care | Payer: Medicaid Other | Attending: Emergency Medicine | Admitting: Emergency Medicine

## 2017-05-24 ENCOUNTER — Other Ambulatory Visit: Payer: Self-pay

## 2017-05-24 DIAGNOSIS — R05 Cough: Secondary | ICD-10-CM | POA: Diagnosis present

## 2017-05-24 DIAGNOSIS — Z79899 Other long term (current) drug therapy: Secondary | ICD-10-CM | POA: Insufficient documentation

## 2017-05-24 DIAGNOSIS — J069 Acute upper respiratory infection, unspecified: Secondary | ICD-10-CM

## 2017-05-24 DIAGNOSIS — Z3202 Encounter for pregnancy test, result negative: Secondary | ICD-10-CM | POA: Diagnosis not present

## 2017-05-24 LAB — POCT PREGNANCY, URINE: PREG TEST UR: NEGATIVE

## 2017-05-24 MED ORDER — AZITHROMYCIN 250 MG PO TABS: ORAL_TABLET | ORAL | 0 refills | Status: DC

## 2017-05-24 MED ORDER — FLUTICASONE PROPIONATE 50 MCG/ACT NA SUSP: 1.0000 | Freq: Every day | NASAL | 0 refills | Status: AC

## 2017-05-24 NOTE — ED Provider Notes (Signed)
[facility] Emergency Department Provider Note  ____________________________________________   First MD Initiated Contact with Patient 05/24/17 1453     (approximate)  I have reviewed the triage vital signs and the nursing notes.   HISTORY  Chief Complaint URI    HPI Jordan David is a 21 y.o. female [name] of cough and congestion with sore throat for 5 days, states her nose is really congestion, mucous is yellow-green, denies fever or chills, denies chest pain or shortness of breath, patient was also requesting a pregnancy test   Past Medical History:  Diagnosis Date  . Allergy   . Anxiety    controlled with medication  . Asthma   . Back pain    scoliosis and dislocated disc (>1 year of pain)  . Depression    controlled with medication  . Lumbar disc disorder   . Stomach ulcer 2015    Patient Active Problem List   Diagnosis Date Noted  . Depression, major, recurrent, moderate (HCC) 03/22/2016  . Insomnia 03/22/2016    Past Surgical History:  Procedure Laterality Date  . ESOPHAGOGASTRODUODENOSCOPY (EGD) WITH PROPOFOL N/A 05/10/2017   Procedure: ESOPHAGOGASTRODUODENOSCOPY (EGD) WITH PROPOFOL;  Surgeon: Toledo, Boykin Nearingeodoro K, MD;  Location: [facility] ENDOSCOPY;  Service: Gastroenterology;  Laterality: N/A;  . FLEXIBLE SIGMOIDOSCOPY N/A 05/10/2017   Procedure: FLEXIBLE SIGMOIDOSCOPY;  Surgeon: Toledo, Boykin Nearingeodoro K, MD;  Location: [facility] ENDOSCOPY;  Service: Gastroenterology;  Laterality: N/A;    Prior to Admission medications   Medication Sig Start Date End Date Taking? Authorizing Provider  albuterol (PROVENTIL HFA;VENTOLIN HFA) 108 (90 Base) MCG/ACT inhaler Inhale as needed into the lungs for wheezing or shortness of breath.    [provider]  azithromycin (ZITHROMAX Z-PAK) 250 MG tablet 2 pills today then 1 pill a day for 4 days 05/24/17   Sherrie MustacheFisher, Roselyn BeringSusan W, PA-C  buPROPion (WELLBUTRIN XL) 300 MG 24 hr tablet Take 300 mg by mouth every  morning. 11/18/16   [provider]  cholecalciferol (VITAMIN D) 400 units TABS tablet Take 800 Units by mouth.    [provider]  fluticasone (FLONASE) 50 MCG/ACT nasal spray Place 1 spray into both nostrils daily. 05/24/17 05/24/18  Onofre Gains, Roselyn BeringSusan W, PA-C  mirtazapine (REMERON) 30 MG tablet Take 30 mg by mouth at bedtime. 11/18/16   [provider]  phentermine (ADIPEX-P) 37.5 MG tablet Take 37.5 mg daily before breakfast by mouth.    [provider]  sertraline (ZOLOFT) 100 MG tablet Take 100 mg daily by mouth.    [provider]    Allergies Patient has no known allergies.  No family history on file.  Social History Social History   Tobacco Use  . Smoking status: Never Smoker  . Smokeless tobacco: Never Used  Substance Use Topics  . Alcohol use: No  . Drug use: Not on file    Review of Systems  Constitutional: No fever/chills Eyes: No visual changes. ENT: Positive sore throat. Os of runny nose and congestion Respiratory: Positive cough Genitourinary: Negative for dysuria. Musculoskeletal: Negative for back pain. Skin: Negative for rash.    ____________________________________________   PHYSICAL EXAM:  VITAL SIGNS: ED Triage Vitals  Enc Vitals Group     BP 05/24/17 1355 133/64     Pulse Rate 05/24/17 1355 99     Resp 05/24/17 1355 18     Temp 05/24/17 1355 98.1 F (36.7 C)     Temp Source 05/24/17 1355 Oral     SpO2 05/24/17 1355  100 %     Weight 05/24/17 1352 281 lb (127.5 kg)     Height 05/24/17 1352 5\' 3"  (1.6 m)     Head Circumference --      Peak Flow --      Pain Score 05/24/17 1352 3     Pain Loc --      Pain Edu? --      Excl. in GC? --     Constitutional: Alert and oriented. Well appearing and in no acute distress. Eyes: Conjunctivae are normal.  Head: Atraumatic. Nose: Positive congestion/rhinnorhea. Mouth/Throat: Mucous membranes are moist.  There is red and irritated Cardiovascular: Normal  rate, regular rhythm. Sounds are normal Respiratory: Normal respiratory effort.  No retractions lungs are clear to auscultation, cough is dry and hacking GU: deferred Musculoskeletal: FROM all extremities, warm and well perfused Neurologic:  Normal speech and language.  Skin:  Skin is warm, dry and intact. No rash noted. Psychiatric: Mood and affect are normal. Speech and behavior are normal.  ____________________________________________   LABS (all labs ordered are listed, but only abnormal results are displayed)  Labs Reviewed  POC URINE PREG, ED  POCT PREGNANCY, URINE   ____________________________________________   ____________________________________________  RADIOLOGY    ____________________________________________   PROCEDURES  Procedure(s) performed: No      ____________________________________________   INITIAL IMPRESSION / ASSESSMENT AND PLAN / ED COURSE  Pertinent labs & imaging results that were available during my care of the patient were reviewed by me and considered in my medical decision making (see chart for details).  Jordan David is a 21 year old female complains of cough and congestion for 5 days, on exam she has rhinorrhea, irritated throat and dry cough, diagnosis is acute upper respiratory infection, pregnancy test was negative, prescription for Z-Pak, Flonase, patient was instructed to use medications as prescribed, she is to follow-up with her regular doctor if she is not better in 3-5 days, she is to return to the emergency room if she is worsening, she is to use over-the-counter medicines as needed, a school note was given      ____________________________________________   FINAL CLINICAL IMPRESSION(S) / ED DIAGNOSES  Final diagnoses:  Acute URI      NEW MEDICATIONS STARTED DURING THIS VISIT:  This SmartLink is deprecated. Use AVSMEDLIST instead to display the medication list for a patient.   Note:  This document was prepared  using Dragon voice recognition software and may include unintentional dictation errors.    Faythe GheeFisher, Keaja Reaume W, PA-C 05/24/17 1515    Jene EveryKinner, Robert, MD 05/24/17 [phone]

## 2017-05-24 NOTE — Discharge Instructions (Signed)
Follow up with the regular doctor if you're not better in 3-5 days, if you have fever take Tylenol or Advil, take medications as prescribed, your pregnancy test was negative today, and take over-the-counter cold medicine as needed, a school note will be provided

## 2017-05-24 NOTE — ED Notes (Signed)
See triage note  States she developed sore throat and head congestion about 3-4 days ago.states she did have some dizziness couple of days ago.

## 2017-05-24 NOTE — ED Triage Notes (Signed)
Pt c/o cough with sore throat for the past 3-4 days.

## 2019-12-14 ENCOUNTER — Other Ambulatory Visit: Payer: Self-pay

## 2019-12-14 ENCOUNTER — Emergency Department: Payer: Medicaid Other

## 2019-12-14 ENCOUNTER — Emergency Department
Admission: EM | Admit: 2019-12-14 | Discharge: 2019-12-14 | Disposition: A | Discharge status: Home / Self Care | Payer: Medicaid Other | Attending: Emergency Medicine | Admitting: Emergency Medicine

## 2019-12-14 ENCOUNTER — Encounter: Payer: Self-pay | Admitting: Emergency Medicine

## 2019-12-14 DIAGNOSIS — J45909 Unspecified asthma, uncomplicated: Secondary | ICD-10-CM | POA: Insufficient documentation

## 2019-12-14 DIAGNOSIS — Z20822 Contact with and (suspected) exposure to covid-19: Secondary | ICD-10-CM | POA: Insufficient documentation

## 2019-12-14 DIAGNOSIS — J069 Acute upper respiratory infection, unspecified: Secondary | ICD-10-CM | POA: Insufficient documentation

## 2019-12-14 LAB — GROUP A STREP BY PCR: Group A Strep by PCR: NOT DETECTED

## 2019-12-14 LAB — SARS CORONAVIRUS 2 BY RT PCR (HOSPITAL ORDER, PERFORMED IN ~~LOC~~ HOSPITAL LAB): SARS Coronavirus 2: NEGATIVE

## 2019-12-14 MED ORDER — PSEUDOEPH-BROMPHEN-DM 30-2-10 MG/5ML PO SYRP: 5.0000 mL | ORAL_SOLUTION | Freq: Four times a day (QID) | ORAL | 0 refills | Status: DC | PRN

## 2019-12-14 NOTE — ED Provider Notes (Signed)
[facility] Emergency Department Provider Note   ____________________________________________   First MD Initiated Contact with Patient 12/14/19 1329     (approximate)  I have reviewed the triage vital signs and the nursing notes.   HISTORY  Chief Complaint Cough and Nasal Congestion    HPI Jordan David is a 24 y.o. female patient complains of nasal and chest congestion with productive cough.  Patient also complain of sore throat.  Patient states cough and congestion for 4 days sore throat started yesterday.  Patient unsure of fever.  Patient denies nausea, vomiting, diarrhea.  Patient denies loss of taste or smell.  Patient denies recent travel or known contact with COVID-19.         Past Medical History:  Diagnosis Date  . Allergy   . Anxiety    controlled with medication  . Asthma   . Back pain    scoliosis and dislocated disc (>1 year of pain)  . Depression    controlled with medication  . Lumbar disc disorder   . Stomach ulcer 2015    Patient Active Problem List   Diagnosis Date Noted  . Depression, major, recurrent, moderate (HCC) 03/22/2016  . Insomnia 03/22/2016    Past Surgical History:  Procedure Laterality Date  . ESOPHAGOGASTRODUODENOSCOPY (EGD) WITH PROPOFOL N/A 05/10/2017   Procedure: ESOPHAGOGASTRODUODENOSCOPY (EGD) WITH PROPOFOL;  Surgeon: Toledo, Boykin Nearing, MD;  Location: [facility] ENDOSCOPY;  Service: Gastroenterology;  Laterality: N/A;  . FLEXIBLE SIGMOIDOSCOPY N/A 05/10/2017   Procedure: FLEXIBLE SIGMOIDOSCOPY;  Surgeon: Toledo, Boykin Nearing, MD;  Location: [facility] ENDOSCOPY;  Service: Gastroenterology;  Laterality: N/A;    Prior to Admission medications   Medication Sig Start Date End Date Taking? Authorizing Provider  albuterol (PROVENTIL HFA;VENTOLIN HFA) 108 (90 Base) MCG/ACT inhaler Inhale as needed into the lungs for wheezing or shortness of breath.    [provider]  brompheniramine-pseudoephedrine-DM  30-2-10 MG/5ML syrup Take 5 mLs by mouth 4 (four) times daily as needed. 12/14/19   Joni Reining, PA-C  buPROPion (WELLBUTRIN XL) 300 MG 24 hr tablet Take 300 mg by mouth every morning. 11/18/16   [provider]  cholecalciferol (VITAMIN D) 400 units TABS tablet Take 800 Units by mouth.    [provider]  fluticasone (FLONASE) 50 MCG/ACT nasal spray Place 1 spray into both nostrils daily. 05/24/17 05/24/18  Fisher, Roselyn Bering, PA-C  mirtazapine (REMERON) 30 MG tablet Take 30 mg by mouth at bedtime. 11/18/16   [provider]  phentermine (ADIPEX-P) 37.5 MG tablet Take 37.5 mg daily before breakfast by mouth.    [provider]  sertraline (ZOLOFT) 100 MG tablet Take 100 mg daily by mouth.    [provider]    Allergies Patient has no known allergies.  No family history on file.  Social History Social History   Tobacco Use  . Smoking status: Never Smoker  . Smokeless tobacco: Never Used  Substance Use Topics  . Alcohol use: No  . Drug use: Not on file    Review of Systems Constitutional: No fever/chills Eyes: No visual changes. ENT: Sore throat Cardiovascular: Denies chest pain. Respiratory: Denies shortness of breath.  Nonproductive cough Gastrointestinal: No abdominal pain.  No nausea, no vomiting.  No diarrhea.  No constipation. Genitourinary: Negative for dysuria. Musculoskeletal: Negative for back pain. Skin: Negative for rash. Neurological: Negative for headaches, focal weakness or numbness. Psychiatric:  Anxiety and depression. ____________________________________________   PHYSICAL EXAM:  VITAL SIGNS: ED Triage Vitals  Enc Vitals Group     BP 12/14/19 1249 120/78     Pulse Rate 12/14/19 1249 88     Resp 12/14/19 1249 18     Temp 12/14/19 1249 98.9 F (37.2 C)     Temp Source 12/14/19 1249 Oral     SpO2 12/14/19 1249 99 %     Weight 12/14/19 1218 290 lb (131.5 kg)     Height 12/14/19 1218 5\' 3"  (1.6 m)     Head  Circumference --      Peak Flow --      Pain Score 12/14/19 1218 0     Pain Loc --      Pain Edu? --      Excl. in GC? --     Constitutional: Alert and oriented. Well appearing and in no acute distress.  BMI 51.37. Eyes: Conjunctivae are normal. PERRL. EOMI. Head: Atraumatic. Nose: Edematous nasal turbinates with thick rhinorrhea.   Mouth/Throat: Mucous membranes are moist.  Oropharynx non-erythematous.  Postnasal drainage. Neck: No stridor.  Hematological/Lymphatic/Immunilogical: No cervical lymphadenopathy. Cardiovascular: Normal rate, regular rhythm. Grossly normal heart sounds.  Good peripheral circulation. Respiratory: Normal respiratory effort.  No retractions. Lungs CTAB. Gastrointestinal: Soft and nontender. No distention. No abdominal bruits. No CVA tenderness. Musculoskeletal: No lower extremity tenderness nor edema.  No joint effusions. Neurologic:  Normal speech and language. No gross focal neurologic deficits are appreciated. No gait instability. Skin:  Skin is warm, dry and intact. No rash noted. Psychiatric: Mood and affect are normal. Speech and behavior are normal.  ____________________________________________   LABS (all labs ordered are listed, but only abnormal results are displayed)  Labs Reviewed  GROUP A STREP BY PCR  SARS CORONAVIRUS 2 BY RT PCR (HOSPITAL ORDER, PERFORMED IN [facility])   ____________________________________________  EKG   ____________________________________________  RADIOLOGY  ED MD interpretation:    Official radiology report(s): DG Chest Portable 1 View  Result Date: 12/14/2019 CLINICAL DATA:  Productive cough for several days EXAM: PORTABLE CHEST 1 VIEW COMPARISON:  03/20/2014 FINDINGS: The heart size and mediastinal contours are within normal limits. Both lungs are clear. The visualized skeletal structures are unremarkable. IMPRESSION: No active disease. Electronically Signed   By: 03/22/2014 M.D.   On:  12/14/2019 14:13    ____________________________________________   PROCEDURES  Procedure(s) performed (including Critical Care):  Procedures   ____________________________________________   INITIAL IMPRESSION / ASSESSMENT AND PLAN / ED COURSE  As part of my medical decision making, I reviewed the following data within the electronic MEDICAL RECORD NUMBER     Patient presents with 1 week of nasal congestion and nonproductive cough.  Discussed negative strep and x-ray results with patient.  Patient complaint physical exam consistent with viral respiratory illness.  Patient advised to self quarantine pending results of COVID-19 test.  Patient given discharge care instruction and prescription for Bromfed-DM.  Patient advised follow-up PCP.    GREGORIA SELVY was evaluated in Emergency Department on 12/14/2019 for the symptoms described in the history of present illness. She was evaluated in the context of the global COVID-19 pandemic, which necessitated consideration that the patient might be at risk for infection with the SARS-CoV-2 virus that causes COVID-19. Institutional protocols and algorithms that pertain to the evaluation of patients at risk for COVID-19 are in a state of rapid change based on information released by regulatory bodies including the CDC and federal and state organizations. These policies and algorithms were followed during the patient's care in  the ED.      ____________________________________________   FINAL CLINICAL IMPRESSION(S) / ED DIAGNOSES  Final diagnoses:  Viral URI with cough     ED Discharge Orders         Ordered    brompheniramine-pseudoephedrine-DM 30-2-10 MG/5ML syrup  4 times daily PRN     Discontinue  Reprint     12/14/19 1441           Note:  This document was prepared using Dragon voice recognition software and may include unintentional dictation errors.    Sable Feil, PA-C 12/14/19 1445    Blake Divine, MD 12/15/19  [id number]

## 2019-12-14 NOTE — Discharge Instructions (Signed)
Follow discharge care instruction take medication as directed. °

## 2019-12-14 NOTE — ED Triage Notes (Signed)
Pt reports cough and congestions for the past week, denies fevers or other sx's.

## 2020-12-30 ENCOUNTER — Other Ambulatory Visit: Payer: Self-pay

## 2020-12-30 ENCOUNTER — Ambulatory Visit (LOCAL_COMMUNITY_HEALTH_CENTER): Payer: Medicaid Other

## 2020-12-30 DIAGNOSIS — Z111 Encounter for screening for respiratory tuberculosis: Secondary | ICD-10-CM

## 2021-01-02 ENCOUNTER — Other Ambulatory Visit: Payer: Self-pay

## 2021-01-02 ENCOUNTER — Ambulatory Visit (LOCAL_COMMUNITY_HEALTH_CENTER): Payer: Medicaid Other

## 2021-01-02 DIAGNOSIS — Z111 Encounter for screening for respiratory tuberculosis: Secondary | ICD-10-CM

## 2021-01-02 LAB — TB SKIN TEST
Induration: 0 mm
TB Skin Test: NEGATIVE

## 2021-03-16 ENCOUNTER — Encounter: Payer: Medicaid Other | Admitting: Obstetrics and Gynecology

## 2021-03-18 ENCOUNTER — Encounter: Payer: Medicaid Other | Admitting: Obstetrics and Gynecology

## 2021-04-15 ENCOUNTER — Other Ambulatory Visit: Payer: Self-pay

## 2021-04-15 ENCOUNTER — Encounter: Payer: Self-pay | Admitting: Obstetrics and Gynecology

## 2021-04-15 ENCOUNTER — Ambulatory Visit (INDEPENDENT_AMBULATORY_CARE_PROVIDER_SITE_OTHER): Payer: BC Managed Care – PPO | Admitting: Obstetrics and Gynecology

## 2021-04-15 ENCOUNTER — Other Ambulatory Visit (HOSPITAL_COMMUNITY)
Admission: RE | Admit: 2021-04-15 | Discharge: 2021-04-15 | Disposition: A | Discharge status: Home / Self Care | Payer: BC Managed Care – PPO | Source: Ambulatory Visit | Attending: Obstetrics and Gynecology | Admitting: Obstetrics and Gynecology

## 2021-04-15 VITALS — BP 145/82 | Ht 63.0 in | Wt 303.6 lb

## 2021-04-15 DIAGNOSIS — Z8742 Personal history of other diseases of the female genital tract: Secondary | ICD-10-CM

## 2021-04-15 DIAGNOSIS — Z124 Encounter for screening for malignant neoplasm of cervix: Secondary | ICD-10-CM | POA: Diagnosis not present

## 2021-04-15 DIAGNOSIS — R875 Abnormal microbiological findings in specimens from female genital organs: Secondary | ICD-10-CM | POA: Insufficient documentation

## 2021-04-15 DIAGNOSIS — N912 Amenorrhea, unspecified: Secondary | ICD-10-CM | POA: Diagnosis not present

## 2021-04-15 DIAGNOSIS — E282 Polycystic ovarian syndrome: Secondary | ICD-10-CM | POA: Diagnosis not present

## 2021-04-15 DIAGNOSIS — R7303 Prediabetes: Secondary | ICD-10-CM

## 2021-04-15 DIAGNOSIS — L732 Hidradenitis suppurativa: Secondary | ICD-10-CM

## 2021-04-15 DIAGNOSIS — N760 Acute vaginitis: Secondary | ICD-10-CM | POA: Diagnosis not present

## 2021-04-15 DIAGNOSIS — Z9189 Other specified personal risk factors, not elsewhere classified: Secondary | ICD-10-CM

## 2021-04-15 MED ORDER — CLINDAMYCIN PHOSPHATE 1 % EX GEL: Freq: Two times a day (BID) | CUTANEOUS | 11 refills | Status: DC

## 2021-04-15 NOTE — Progress Notes (Signed)
0

## 2021-04-15 NOTE — Patient Instructions (Signed)
Institute of Medicine Recommended Dietary Allowances for Calcium and Vitamin D  Age (yr) Calcium Recommended Dietary Allowance (mg/day) Vitamin D Recommended Dietary Allowance (international units/day)  9-18 1,300 600  19-50 1,000 600  51-70 1,200 600  71 and older 1,200 800  Data from Institute of Medicine. Dietary reference intakes: calcium, vitamin D. [place], [place]: National Academies Press; 2011.   Exercising to Stay Healthy To become healthy and stay healthy, it is recommended that you do moderate-intensity and vigorous-intensity exercise. You can tell that you are exercising at a moderate intensity if your heart starts beating faster and you start breathing faster but can still hold a conversation. You can tell that you are exercising at a vigorous intensity if you are breathing much harder and faster and cannot hold a conversation while exercising. How can exercise benefit me? Exercising regularly is important. It has many health benefits, such as: Improving overall fitness, flexibility, and endurance. Increasing bone density. Helping with weight control. Decreasing body fat. Increasing muscle strength and endurance. Reducing stress and tension, anxiety, depression, or anger. Improving overall health. What guidelines should I follow while exercising? Before you start a new exercise program, talk with your health care provider. Do not exercise so much that you hurt yourself, feel dizzy, or get very short of breath. Wear comfortable clothes and wear shoes with good support. Drink plenty of water while you exercise to prevent dehydration or heat stroke. Work out until your breathing and your heartbeat get faster (moderate intensity). How often should I exercise? Choose an activity that you enjoy, and set realistic goals. Your health care provider can help you make an activity plan that is individually designed and works best for you. Exercise regularly as told by your health  care provider. This may include: Doing strength training two times a week, such as: Lifting weights. Using resistance bands. Push-ups. Sit-ups. Yoga. Doing a certain intensity of exercise for a given amount of time. Choose from these options: A total of 150 minutes of moderate-intensity exercise every week. A total of 75 minutes of vigorous-intensity exercise every week. A mix of moderate-intensity and vigorous-intensity exercise every week. Children, pregnant women, people who have not exercised regularly, people who are overweight, and older adults may need to talk with a health care provider about what activities are safe to perform. If you have a medical condition, be sure to talk with your health care provider before you start a new exercise program. What are some exercise ideas? Moderate-intensity exercise ideas include: Walking 1 mile (1.6 km) in about 15 minutes. Biking. Hiking. Golfing. Dancing. Water aerobics. Vigorous-intensity exercise ideas include: Walking 4.5 miles (7.2 km) or more in about 1 hour. Jogging or running 5 miles (8 km) in about 1 hour. Biking 10 miles (16.1 km) or more in about 1 hour. Lap swimming. Roller-skating or in-line skating. Cross-country skiing. Vigorous competitive sports, such as football, basketball, and soccer. Jumping rope. Aerobic dancing. What are some everyday activities that can help me get exercise? Yard work, such as: Pushing a lawn mower. Raking and bagging leaves. Washing your car. Pushing a stroller. Shoveling snow. Gardening. Washing windows or floors. How can I be more active in my day-to-day activities? Use stairs instead of an elevator. Take a walk during your lunch break. If you drive, park your car farther away from your work or school. If you take public transportation, get off one stop early and walk the rest of the way. Stand up or walk around during all of   your indoor phone calls. Get up, stretch, and walk  around every 30 minutes throughout the day. Enjoy exercise with a friend. Support to continue exercising will help you keep a regular routine of activity. Where to find more information You can find more information about exercising to stay healthy from: U.S. Department of Health and Human Services: www.hhs.gov Centers for Disease Control and Prevention (CDC): www.cdc.gov Summary Exercising regularly is important. It will improve your overall fitness, flexibility, and endurance. Regular exercise will also improve your overall health. It can help you control your weight, reduce stress, and improve your bone density. Do not exercise so much that you hurt yourself, feel dizzy, or get very short of breath. Before you start a new exercise program, talk with your health care provider. This information is not intended to replace advice given to you by your health care provider. Make sure you discuss any questions you have with your health care provider. Document Revised: 10/10/2020 Document Reviewed: 10/10/2020 Elsevier Patient Education  2022 Elsevier Inc. Budget-Friendly Healthy Eating There are many ways to save money at the grocery store and continue to eat healthy. You can be successful if you: Plan meals according to your budget. Make a grocery list and only purchase food according to your grocery list. Prepare food yourself at home. What are tips for following this plan? Reading food labels Compare food labels between brand name foods and the store brand. Often the nutritional value is the same, but the store brand is lower cost. Look for products that do not have added sugar, fat, or salt (sodium). These often cost the same but are healthier for you. Products may be labeled as: Sugar-free. Nonfat. Low-fat. Sodium-free. Low-sodium. Look for lean ground beef labeled as at least 92% lean and 8% fat. Shopping  Buy only the items on your grocery list and go only to the areas of the store  that have the items on your list. Use coupons only for foods and brands you normally buy. Avoid buying items you wouldn't normally buy simply because they are on sale. Check online and in newspapers for weekly deals. Buy healthy items from the bulk bins when available, such as herbs, spices, flour, pasta, nuts, and dried fruit. Buy fruits and vegetables that are in season. Prices are usually lower on in-season produce. Look at the unit price on the price tag. Use it to compare different brands and sizes to find out which item is the best deal. Choose healthy items that are often low-cost, such as carrots, potatoes, apples, bananas, and oranges. Dried or canned beans are a low-cost protein source. Buy in bulk and freeze extra food. Items you can buy in bulk include meats, fish, poultry, frozen fruits, and frozen vegetables. Avoid buying "ready-to-eat" foods, such as pre-cut fruits and vegetables and pre-made salads. If possible, shop around to discover where you can find the best prices. Consider other retailers such as dollar stores, larger wholesale stores, local fruit and vegetable stands, and farmers markets. Do not shop when you are hungry. If you shop while hungry, it may be hard to stick to your list and budget. Resist impulse buying. Use your grocery list as your official plan for the week. Buy a variety of vegetables and fruits by purchasing fresh, frozen, and canned items. Look at the top and bottom shelves for deals. Foods at eye level (eye level of an adult or child) are usually more expensive. Be efficient with your time when shopping. The more time you   spend at the store, the more money you are likely to spend. To save money when choosing more expensive foods like meats and dairy: Choose cheaper cuts of meat, such as bone-in chicken thighs and drumsticks instead of skinless and boneless chicken. When you are ready to prepare the chicken, you can remove the skin yourself to make it  healthier. Choose lean meats like chicken or [place] instead of beef. Choose canned seafood, such as tuna, salmon, or sardines. Buy eggs as a low-cost source of protein. Buy dried beans and peas, such as lentils, split peas, or kidney beans instead of meats. Dried beans and peas are a good alternative source of protein. Buy the larger tubs of yogurt instead of individual-sized containers. Choose water instead of sodas and other sweetened beverages. Avoid buying chips, cookies, and other "junk food." These items are usually expensive and not healthy. Cooking Make extra food and freeze the extras in meal-sized containers or in individual portions for fast meals and snacks. Pre-cook on days when you have extra time to prepare meals in advance. You can keep these meals in the fridge or freezer and reheat for a quick meal. When you come home from the grocery store, wash, peel, and cut fruits and vegetables so they are ready to use and eat. This will help reduce food waste. Meal planning Do not eat out or get fast food. Prepare food at home. Make a grocery list and make sure to bring it with you to the store. If you have a smart phone, you could use your phone to create your shopping list. Plan meals and snacks according to a grocery list and budget you create. Use leftovers in your meal plan for the week. Look for recipes where you can cook once and make enough food for two meals. Prepare budget-friendly types of meals like stews, casseroles, and stir-fry dishes. Try some meatless meals or try "no cook" meals like salads. Make sure that half your plate is filled with fruits or vegetables. Choose from fresh, frozen, or canned fruits and vegetables. If eating canned, remember to rinse them before eating. This will remove any excess salt added for packaging. Summary Eating healthy on a budget is possible if you plan your meals according to your budget, purchase according to your budget and grocery list,  and prepare food yourself. Tips for buying more food on a limited budget include buying generic brands, using coupons only for foods you normally buy, and buying healthy items from the bulk bins when available. Tips for buying cheaper food to replace expensive food include choosing cheaper, lean cuts of meat, and buying dried beans and peas. This information is not intended to replace advice given to you by your health care provider. Make sure you discuss any questions you have with your health care provider. Document Revised: 03/27/2020 Document Reviewed: 03/27/2020 Elsevier Patient Education  2022 Elsevier Inc. Managing Anxiety, Adult After being diagnosed with an anxiety disorder, you may be relieved to know why you have felt or behaved a certain way. You may also feel overwhelmed about the treatment ahead and what it will mean for your life. With care and support, you can manage this condition and recover from it. How to manage lifestyle changes Managing stress and anxiety Stress is your body's reaction to life changes and events, both good and bad. Most stress will last just a few hours, but stress can be ongoing and can lead to more than just stress. Although stress can play a  major role in anxiety, it is not the same as anxiety. Stress is usually caused by something external, such as a deadline, test, or competition. Stress normally passes after the triggering event has ended.  Anxiety is caused by something internal, such as imagining a terrible outcome or worrying that something will go wrong that will devastate you. Anxiety often does not go away even after the triggering event is over, and it can become long-term (chronic) worry. It is important to understand the differences between stress and anxiety and to manage your stress effectively so that it does not lead to an anxious response. Talk with your health care provider or a counselor to learn more about reducing anxiety and stress. He or  she may suggest tension reduction techniques, such as: Music therapy. This can include creating or listening to music that you enjoy and that inspires you. Mindfulness-based meditation. This involves being aware of your normal breaths while not trying to control your breathing. It can be done while sitting or walking. Centering prayer. This involves focusing on a word, phrase, or sacred image that means something to you and brings you peace. Deep breathing. To do this, expand your stomach and inhale slowly through your nose. Hold your breath for 3-5 seconds. Then exhale slowly, letting your stomach muscles relax. Self-talk. This involves identifying thought patterns that lead to anxiety reactions and changing those patterns. Muscle relaxation. This involves tensing muscles and then relaxing them. Choose a tension reduction technique that suits your lifestyle and personality. These techniques take time and practice. Set aside 5-15 minutes a day to do them. Therapists can offer counseling and training in these techniques. The training to help with anxiety may be covered by some insurance plans. Other things you can do to manage stress and anxiety include: Keeping a stress/anxiety diary. This can help you learn what triggers your reaction and then learn ways to manage your response. Thinking about how you react to certain situations. You may not be able to control everything, but you can control your response. Making time for activities that help you relax and not feeling guilty about spending your time in this way. Visual imagery and yoga can help you stay calm and relax.  Medicines Medicines can help ease symptoms. Medicines for anxiety include: Anti-anxiety drugs. Antidepressants. Medicines are often used as a primary treatment for anxiety disorder. Medicines will be prescribed by a health care provider. When used together, medicines, psychotherapy, and tension reduction techniques may be the most  effective treatment. Relationships Relationships can play a big part in helping you recover. Try to spend more time connecting with trusted friends and family members. Consider going to couples counseling, taking family education classes, or going to family therapy. Therapy can help you and others better understand your condition. How to recognize changes in your anxiety Everyone responds differently to treatment for anxiety. Recovery from anxiety happens when symptoms decrease and stop interfering with your daily activities at home or work. This may mean that you will start to: Have better concentration and focus. Worry will interfere less in your daily thinking. Sleep better. Be less irritable. Have more energy. Have improved memory. It is important to recognize when your condition is getting worse. Contact your health care provider if your symptoms interfere with home or work and you feel like your condition is not improving. Follow these instructions at home: Activity Exercise. Most adults should do the following: Exercise for at least 150 minutes each week. The exercise should increase  your heart rate and make you sweat (moderate-intensity exercise). Strengthening exercises at least twice a week. Get the right amount and quality of sleep. Most adults need 7-9 hours of sleep each night. Lifestyle  Eat a healthy diet that includes plenty of vegetables, fruits, whole grains, low-fat dairy products, and lean protein. Do not eat a lot of foods that are high in solid fats, added sugars, or salt. Make choices that simplify your life. Do not use any products that contain nicotine or tobacco, such as cigarettes, e-cigarettes, and chewing tobacco. If you need help quitting, ask your health care provider. Avoid caffeine, alcohol, and certain over-the-counter cold medicines. These may make you feel worse. Ask your pharmacist which medicines to avoid. General instructions Take over-the-counter and  prescription medicines only as told by your health care provider. Keep all follow-up visits as told by your health care provider. This is important. Where to find support You can get help and support from these sources: Self-help groups. Online and [employer]. A trusted spiritual leader. Couples counseling. Family education classes. Family therapy. Where to find more information You may find that joining a support group helps you deal with your anxiety. The following sources can help you locate counselors or support groups near you: Mental Health America: www.mentalhealthamerica.net Anxiety and Depression Association of [place] (ADAA): ProgramCam.de [employer] on Mental Illness (NAMI): www.nami.org Contact a health care provider if you: Have a hard time staying focused or finishing daily tasks. Spend many hours a day feeling worried about everyday life. Become exhausted by worry. Start to have headaches, feel tense, or have nausea. Urinate more than normal. Have diarrhea. Get help right away if you have: A racing heart and shortness of breath. Thoughts of hurting yourself or others. If you ever feel like you may hurt yourself or others, or have thoughts about taking your own life, get help right away. You can go to your nearest emergency department or call: Your local emergency services (911 in the [place].S.). A suicide crisis helpline, such as the National Suicide Prevention Lifeline at 551-732-0011. This is open 24 hours a day. Summary Taking steps to learn and use tension reduction techniques can help calm you and help prevent triggering an anxiety reaction. When used together, medicines, psychotherapy, and tension reduction techniques may be the most effective treatment. Family, friends, and partners can play a big part in helping you recover from an anxiety disorder. This information is not intended to replace advice given to you by your health care provider. Make  sure you discuss any questions you have with your health care provider. Document Revised: 11/14/2018 Document Reviewed: 11/14/2018 Elsevier Patient Education  [address]   [facility] [place] [place] Same Day Access Hours:  Monday, Wednesday and Friday, 8am - 3pm Walk-In Crisis Hours: 7 days/wk, 8am - 8pm [address] [place], [place] [address] [phone]  [facility] [phone]  Mobile Crisis Unit [phone]   Therapists/Counselors/Psychologists  [facility] [address] [facility] [address] [place], [place] [address] [phone]  Karen [place], [place]  & Jacqlyn Krauss [place]    [phone]        [address]       [place], [place] [address]        Ival Bible, CSW [phone] [address] [place], [place] [address]  Harle Battiest, LPC        [phone]        [address]  [place], [place] [address]        Chyrel Masson, MS [phone] [address] [place], [place] [address]   Oscar La, LMFT       [phone]        [address]       [place], [place] [address]        Felecia Jan [phone] [address] [place], [place] [address]  [place]        [phone]        [address]       [place], [place] [address]        Kerin Salen [phone] [address] [place], [place] [address]  Tyron Russell, PsyD       [phone]        [address]       [place], [place] [address]        Elita Quick, LPC [phone] [address]  [place], [place] [address]   Debarah Crape        [phone]        [address] [place], [place] [address]         Rosana Hoes [facility] [phone] [email or web address]   Sation Konchella       [phone]        [address]       [place], [place] [address]        [facility] [phone] [email or web address]   ADD/ADHD Testing:   Lynetta Mare, PhD [address] [place], [place] [address] [phone] [facility] in [place], [place]   [place], [place] [facility] [address] [place], [place] [address] Phone: [phone] Fax: [phone] [place] , [place], Medicaid   Legrand Rams [address] [place], [place] [address] [phone]    [place] [facility] in [place], [place]   ----------------------------------------------------------   Neuropsych Testing Dr Thora Lance [address] [place], [place]: [phone] F: [phone] [email or web address]    [facility]  Lubertha Basque [place] Therapist: [phone]     Eating Disorders   [facility]  [email or web address] [phone]  [facility] [phone], [phone]   Substance Abuse Treatment   [facility], [place], [place] [facility] [place], [place] [address] [facility] [place], [place], [place], [place] [employer], [place], [place] [facility] [place], [place], [place]   [employer] Care Program:  [email or web address] Hidradenitis Suppurativa Hidradenitis suppurativa is a long-term (chronic) skin disease. It is similar to a severe form of acne, but it affects areas of the body where acne would be unusual, especially areas of the body where skin rubs against skin and becomes moist. These include: Underarms. Groin. Genital area. Buttocks. Upper thighs. Breasts. Hidradenitis suppurativa may start out as small lumps or pimples caused by blocked sweat glands or hair follicles. Pimples may develop into deep sores that break open (rupture) and drain pus. Over time, affected areas of skin may thicken and become scarred. This condition is rare and does  not spread from person to person (non-contagious). What are the causes? The exact cause of this condition is not known. It may be related to: Female and female hormones. An overactive disease-fighting system (immune system). The immune system may over-react to blocked hair follicles or sweat glands and cause swelling and pus-filled sores.  What increases the risk? You are more likely to develop this condition if you: Are female. Are 83-45 years old. Have a family history of hidradenitis suppurativa. Have a personal history of acne. Are overweight. Smoke. Take the medicine lithium. What are the signs or symptoms? The first symptoms are usually painful bumps in the skin, similar to pimples. The condition may get worse over time (progress), or it may only cause mild symptoms. If the disease progresses, symptoms may include: Skin bumps getting bigger and growing deeper into the skin. Bumps rupturing and draining pus. Itchy, infected skin. Skin getting thicker and scarred. Tunnels under the skin (fistulas) where pus drains from a bump. Pain during daily activities, such as pain during walking if your groin area is affected. Emotional problems, such as stress or depression. This condition may affect your appearance and your ability or willingness to wear certain clothes or do certain activities. How is this diagnosed? This condition is diagnosed by a health care provider who specializes in skin diseases (dermatologist). You may be diagnosed based on: Your symptoms and medical history. A physical exam. Testing a pus sample for infection. Blood tests. How is this treated? Your treatment will depend on how severe your symptoms are. The same treatment will not work for everybody with this condition. You may need to try several treatments to find what works best for you. Treatment may include: Cleaning and bandaging (dressing) your wounds as needed. Lifestyle changes, such as new skin care  routines. Taking medicines, such as: Antibiotics. Acne medicines. Medicines to reduce the activity of the immune system. A diabetes medicine (metformin). Birth control pills, for women. Steroids to reduce swelling and pain. Working with a mental health care provider, if you experience emotional distress due to this condition. If you have severe symptoms that do not get better with medicine, you may need surgery. Surgery may involve: Using a laser to clear the skin and remove hair follicles. Opening and draining deep sores. Removing the areas of skin that are diseased and scarred. Follow these instructions at home: Medicines  Take over-the-counter and prescription medicines only as told by your health care provider. If you were prescribed an antibiotic medicine, take it as told by your health care provider. Do not stop taking the antibiotic even if your condition improves. Skin care If you have open wounds, cover them with a clean dressing as told by your health care provider. Keep wounds clean by washing them gently with soap and water when you bathe. Do not shave the areas where you get hidradenitis suppurativa. Do not wear deodorant. Wear loose-fitting clothes. Try to avoid getting overheated or sweaty. If you get sweaty or wet, change into clean, dry clothes as soon as you can. To help relieve pain and itchiness, cover sore areas with a warm, clean washcloth (warm compress) for 5-10 minutes as often as needed. If told by your health care provider, take a bleach bath twice a week: Fill your bathtub halfway with water. Pour in  cup of unscented household bleach. Soak in the tub for 5-10 minutes. Only soak from the neck down. Avoid water on your face and hair. Shower to rinse off the bleach from your skin. General instructions Learn as much as you can about your disease so that you have an active role in your treatment. Work closely with your health care provider to find treatments  that work for you. If you are overweight, work with your health care provider to lose weight as recommended.  Do not use any products that contain nicotine or tobacco, such as cigarettes and e-cigarettes. If you need help quitting, ask your health care provider. If you struggle with living with this condition, talk with your health care provider or work with a mental health care provider as recommended. Keep all follow-up visits as told by your health care provider. This is important. Where to find more information Hidradenitis Suppurativa Foundation, Inc.: https://www.hs-foundation.org/ American Academy of Dermatology: InstantFinish[employer] Contact a health care provider if you have: A flare-up of hidradenitis suppurativa. A fever or chills. Trouble controlling your symptoms at home. Trouble doing your daily activities because of your symptoms. Trouble dealing with emotional problems related to your condition. Summary Hidradenitis suppurativa is a long-term (chronic) skin disease. It is similar to a severe form of acne, but it affects areas of the body where acne would be unusual. The first symptoms are usually painful bumps in the skin, similar to pimples. The condition may only cause mild symptoms, or it may get worse over time (progress). If you have open wounds, cover them with a clean dressing as told by your health care provider. Keep wounds clean by washing them gently with soap and water when you bathe. Besides skin care, treatment may include medicines, laser treatment, and surgery. This information is not intended to replace advice given to you by your health care provider. Make sure you discuss any questions you have with your health care provider. Document Revised: 04/08/2020 Document Reviewed: 04/08/2020 Elsevier Patient Education  2022 Elsevier Inc. Hirsutism and Masculinization Hirsutism is when a female has an excessive amount of hair in an area where a female would typically have  hair growth, such as on the face, chest, or back. Masculinization is when a female's body develops certain characteristics that are like a female's body. What are the causes? This condition may be caused by: Polycystic ovary syndrome (PCOS). This is the most common cause. Certain medicines, such as androgens and anabolic steroids. Cushing's syndrome. Tumors. Increased levels of androgen (testosterone). What increases the risk? The following factors may make you more likely to develop this condition: Family history. Obesity. What are the signs or symptoms? Hirsutism Symptoms of this condition include excess hair growth in areas where women typically do not grow hair, such as on the: Face. Chest. Thighs. Back. Masculinization Symptoms of this condition include: Deepening voice. Decreased breast size. Increased muscle mass. Thinning hair on the head (balding). Irregular or no menstrual periods. Enlarged clitoris. How is this diagnosed? These conditions may be diagnosed based on: Your medical history. A physical exam. Tests that may include: Blood tests. Imaging studies. Your health care provider may recommend that you follow up with specialists to understand the cause of your condition or to help treat it. How is this treated? This condition may be treated by: Taking medicines to help control hair growth. Making lifestyle changes to help reduce hormone levels that are contributing to your condition. Removing unwanted hair by shaving, using creams, or waxing. More permanent ways to remove hair include electrolysis and laser treatments. If your symptoms are caused by certain medicines, your health care provider may have you stop taking them. Follow these instructions at home: Take over-the-counter and prescription medicines only as told by your health care provider. Talk with your health care provider about your treatment options and what may be best for you. Maintain a healthy  weight. If needed, talk with your health care provider about how to lose weight. Keep all follow-up visits.  This is important. Contact a health care provider if: Your symptoms suddenly get worse. You develop acne. You have irregular menstrual periods. You stop having your menstrual period. You feel a lump in your lower abdomen. Summary Hirsutism is when a female has an excessive amount of hair in an area where a female would typically have hair growth, such as on the face, chest, thighs, or back. Masculinization is when a female's body develops characteristics like a female's body. This may include a deepening voice, decreased breast size, increased muscle mass, thinning hair (balding), changes in menstrual periods, and clitoris growth. The most common cause of this condition is polycystic ovary syndrome (PCOS). Treatment options include removing unwanted hair, taking medicines, and making lifestyle changes. Talk with your health care provider about which treatments are best for you. Your health care provider may refer you to specialists to find the cause of your condition. This information is not intended to replace advice given to you by your health care provider. Make sure you discuss any questions you have with your health care provider. Document Revised: 11/22/2019 Document Reviewed: 11/22/2019 Elsevier Patient Education  2022 Elsevier Inc. Diet for Polycystic Ovary Syndrome Polycystic ovary syndrome (PCOS) is a common hormonal disorder that affects a woman's reproductive system. It can cause problems with menstrual periods and make it hard to get and stay pregnant. Changing what you eat can help your hormones reach normal levels, improve your health, and help you better manage PCOS. Following a balanced diet can help you lose weight and improve the way that your body uses the hormone insulin to control blood sugar. This may include: Eating low-fat (lean) proteins, complex carbohydrates, fresh  fruits and vegetables, low-fat dairy products, healthy fats, and fiber. Cutting down on calories. Exercising regularly. What are tips for following this plan? Follow a balanced diet for meals and snacks. Eat breakfast, lunch, dinner, and one or two snacks every day. Include protein in each meal and snack. Choose whole grains instead of products that are made with refined flour. Eat a variety of foods. Exercise regularly as told by your health care provider. Aim to do at least 30 minutes of exercise on most days of the week. If you are overweight or obese: Pay attention to how many calories you eat. Cutting down on calories can help you lose weight. Work with your health care provider or a dietitian to figure out how many calories you need each day. What foods should I eat? Fruits Include a variety of colors and types. All fruits are helpful for PCOS. Vegetables Include a variety of colors and types. All vegetables are helpful for PCOS. Grains Whole grains, such as whole wheat. Whole-grain breads, crackers, cereals, and pasta. Unsweetened oatmeal. Bulgur, barley, quinoa, and brown rice. Tortillas made from corn or whole-wheat flour. Meats and other proteins Lean proteins, such as fish, chicken, beans, eggs, and tofu. Dairy Low-fat dairy products, such as skim milk, cheese sticks, and yogurt. Beverages Low-fat or fat-free drinks, such as water, low-fat milk, sugar-free drinks, and small amounts of 100% fruit juice. Seasonings and condiments Ketchup. Mustard. Barbecue sauce. Relish. Low-fat or fat-free mayonnaise. Fats and oils Olive oil or canola oil. Walnuts and almonds. The items listed above may not be a complete list of recommended foods and beverages. Contact a dietitian for more options. What foods should I avoid? Foods that are high in calories or fat, especially saturated or trans fats. Fried foods. Sweets. Products that are made from refined white flour, including  white bread,  pastries, white rice, and pasta. The items listed above may not be a complete list of foods and beverages to avoid. Contact a dietitian for more information. Summary PCOS is a hormonal imbalance that affects a woman's reproductive system. It can cause problems with menstrual periods and make it hard to get and stay pregnant. You can help to manage your PCOS by exercising regularly and eating a healthy, varied diet of vegetables, fruit, whole grains, lean protein, and low-fat dairy products. Changing what you eat can improve the way that your body uses insulin, help your hormones reach normal levels, and help you lose weight. This information is not intended to replace advice given to you by your health care provider. Make sure you discuss any questions you have with your health care provider. Document Revised: 11/22/2019 Document Reviewed: 11/22/2019 Elsevier Patient Education  2022 [employer].

## 2021-04-15 NOTE — Progress Notes (Signed)
Patient ID: Jordan David, female   DOB: [birth year], 25 y.o.   MRN: [id number]  Reason for Consult: Gynecologic Exam   Referred by Sandrea Hughs, NP  Subjective:     HPI:  Jordan David is a 25 y.o. female. She is here for consultation for PCOS. She has been having irregular menstrual cycles. She reports generally she will have 3-4 periods a year. In the past she has been a full year without a menstrual cycle. She has not previously had a pelvic US. She reports abnormal facial hair growth. She reports that she has acne that comes and goes. She reports she is in a relationship and would like to conceive. She is not using contraception at this time. She has been on spironolactone and phentermine in the past.  Gynecological History  Patient's last menstrual period was 02/21/2021. Menarche: 8 Menopause: no  She denies passage of large clots She denies sensations of gushing or flooding of blood. She denies accidents where she bleeds through her clothing. She denies that she changes a saturated pad or tampon more frequently than every hour.  She denies that pain from her periods limits her activities.  History of fibroids, polyps, or ovarian cysts? : no  History of PCOS? yes Hstory of Endometriosis? no History of abnormal pap smears? no Have you had any sexually transmitted infections in the past? no  She identifies as a female. She is sexually active with men.   She denies dyspareunia. She denies postcoital bleeding.  She would like to conceive   Past Medical History:  Diagnosis Date   Allergy    Anxiety    controlled with medication   Asthma    Back pain    scoliosis and dislocated disc (>1 year of pain)   Depression    controlled with medication   Lumbar disc disorder    Stomach ulcer 2015   No family history on file. Past Surgical History:  Procedure Laterality Date   ESOPHAGOGASTRODUODENOSCOPY (EGD) WITH PROPOFOL N/A 05/10/2017   Procedure:  ESOPHAGOGASTRODUODENOSCOPY (EGD) WITH PROPOFOL;  Surgeon: Toledo, Boykin Nearing, MD;  Location: [facility];  Service: Gastroenterology;  Laterality: N/A;   FLEXIBLE SIGMOIDOSCOPY N/A 05/10/2017   Procedure: FLEXIBLE SIGMOIDOSCOPY;  Surgeon: Toledo, Boykin Nearing, MD;  Location: [facility] ENDOSCOPY;  Service: Gastroenterology;  Laterality: N/A;    Short Social History:  Social History   Tobacco Use   Smoking status: Never   Smokeless tobacco: Never  Substance Use Topics   Alcohol use: No    No Known Allergies  Current Outpatient Medications  Medication Sig Dispense Refill   albuterol (PROVENTIL HFA;VENTOLIN HFA) 108 (90 Base) MCG/ACT inhaler Inhale as needed into the lungs for wheezing or shortness of breath.     phentermine (ADIPEX-P) 37.5 MG tablet Take 37.5 mg daily before breakfast by mouth.     brompheniramine-pseudoephedrine-DM 30-2-10 MG/5ML syrup Take 5 mLs by mouth 4 (four) times daily as needed. (Patient not taking: Reported on 04/15/2021) 120 mL 0   buPROPion (WELLBUTRIN XL) 300 MG 24 hr tablet Take 300 mg by mouth every morning. (Patient not taking: Reported on 04/15/2021)  1   cholecalciferol (VITAMIN D) 400 units TABS tablet Take 800 Units by mouth. (Patient not taking: Reported on 04/15/2021)     fluticasone (FLONASE) 50 MCG/ACT nasal spray Place 1 spray into both nostrils daily. 16 g 0   mirtazapine (REMERON) 30 MG tablet Take 30 mg by mouth at bedtime. (Patient not taking: Reported on 04/15/2021)  1   sertraline (ZOLOFT) 100 MG tablet Take 100 mg daily by mouth. (Patient not taking: Reported on 04/15/2021)     Current Facility-Administered Medications  Medication Dose Route Frequency Provider Last Rate Last Admin   betamethasone acetate-betamethasone sodium phosphate (CELESTONE) injection 3 mg  3 mg Intramuscular Once Felecia Shelling, DPM        Review of Systems  Constitutional: Negative for chills, fatigue, fever and unexpected weight change.  HENT: Negative for trouble  swallowing.  Eyes: Negative for loss of vision.  Respiratory: Negative for cough, shortness of breath and wheezing.  Cardiovascular: Negative for chest pain, leg swelling, palpitations and syncope.  GI: Negative for abdominal pain, blood in stool, diarrhea, nausea and vomiting.  GU: Negative for difficulty urinating, dysuria, frequency and hematuria.  Musculoskeletal: Negative for back pain, leg pain and joint pain.  Skin: Negative for rash.  Neurological: Negative for dizziness, headaches, light-headedness, numbness and seizures.  Psychiatric: Negative for behavioral problem, confusion, depressed mood and sleep disturbance.       Objective:  Objective   Vitals:   04/15/21 1053  BP: (!) 145/82  Weight: (!) 303 lb 9.6 oz (137.7 kg)  Height: 5\' 3"  (1.6 m)   Body mass index is 53.78 kg/m.  Physical Exam Vitals and nursing note reviewed. Exam conducted with a chaperone present.  Constitutional:      Appearance: Normal appearance. She is well-developed.  HENT:     Head: Normocephalic and atraumatic.  Eyes:     Extraocular Movements: Extraocular movements intact.     Pupils: Pupils are equal, round, and reactive to light.  Cardiovascular:     Rate and Rhythm: Normal rate and regular rhythm.  Pulmonary:     Effort: Pulmonary effort is normal. No respiratory distress.     Breath sounds: Normal breath sounds.  Abdominal:     General: Abdomen is flat.     Palpations: Abdomen is soft.  Genitourinary:    Comments: External: Normal appearing vulva. No lesions noted.  Speculum examination: Normal appearing cervix. No blood in the vaginal vault. No discharge.   Bimanual examination: Uterus midline, non-tender, normal in size, shape and contour.  No CMT. No adnexal masses. No adnexal tenderness. Pelvis not fixed.  Breast exam: exam not performed Musculoskeletal:        General: No signs of injury.     Cervical back: Normal range of motion.  Skin:    General: Skin is warm and dry.   Neurological:     General: No focal deficit present.     Mental Status: She is alert and oriented to person, place, and time.  Psychiatric:        Behavior: Behavior normal.        Thought Content: Thought content normal.        Judgment: Judgment normal.    Assessment/Plan:    25 yo here for PCOS consultation would like to conceive Labs for amenorrhea Pap smear Pelvic 22 ordered Referral for sleep study Referral for dietary counseling Topical clindamycin for hidradenitis suppurativa   More than 20 minutes were spent face to face with the patient in the room, reviewing the medical record, labs and images, and coordinating care for the patient. The plan of management was discussed in detail and counseling was provided.     [place] MD [facility], [facility] 04/15/2021 11:03 AM

## 2021-04-18 LAB — NUSWAB BV AND CANDIDA, NAA
Atopobium vaginae: HIGH Score — AB
BVAB 2: HIGH Score — AB
Candida albicans, NAA: NEGATIVE
Candida glabrata, NAA: NEGATIVE

## 2021-04-18 LAB — TSH+PRL+FSH+TESTT+LH+DHEA S...
17-Hydroxyprogesterone: 40 ng/dL
Androstenedione: 99 ng/dL (ref 41–262)
DHEA-SO4: 400 ug/dL (ref 110.0–431.7)
FSH: 6.4 m[IU]/mL
LH: 10.7 m[IU]/mL
Prolactin: 7.7 ng/mL (ref 4.8–23.3)
TSH: 1.6 u[IU]/mL (ref 0.450–4.500)
Testosterone, Free: 2.9 pg/mL (ref 0.0–4.2)
Testosterone: 51 ng/dL (ref 13–71)

## 2021-04-18 LAB — HEMOGLOBIN A1C
Est. average glucose Bld gHb Est-mCnc: 120 mg/dL
Hgb A1c MFr Bld: 5.8 % — ABNORMAL HIGH (ref 4.8–5.6)

## 2021-04-20 ENCOUNTER — Other Ambulatory Visit: Payer: Self-pay | Admitting: Obstetrics and Gynecology

## 2021-04-20 DIAGNOSIS — B9689 Other specified bacterial agents as the cause of diseases classified elsewhere: Secondary | ICD-10-CM

## 2021-04-20 DIAGNOSIS — N760 Acute vaginitis: Secondary | ICD-10-CM

## 2021-04-20 LAB — CYTOLOGY - PAP
Chlamydia: NEGATIVE
Comment: NEGATIVE
Comment: NEGATIVE
Comment: NORMAL
Diagnosis: NEGATIVE
Neisseria Gonorrhea: NEGATIVE
Trichomonas: NEGATIVE

## 2021-04-20 MED ORDER — METRONIDAZOLE 500 MG PO TABS: 500.0000 mg | ORAL_TABLET | Freq: Two times a day (BID) | ORAL | 0 refills | Status: DC

## 2021-05-15 ENCOUNTER — Ambulatory Visit
Admission: RE | Admit: 2021-05-15 | Discharge: 2021-05-15 | Disposition: A | Discharge status: Home / Self Care | Payer: BC Managed Care – PPO | Source: Ambulatory Visit | Attending: Obstetrics and Gynecology | Admitting: Obstetrics and Gynecology

## 2021-05-15 DIAGNOSIS — N912 Amenorrhea, unspecified: Secondary | ICD-10-CM | POA: Diagnosis present

## 2021-05-18 ENCOUNTER — Ambulatory Visit (INDEPENDENT_AMBULATORY_CARE_PROVIDER_SITE_OTHER): Payer: BC Managed Care – PPO | Admitting: Obstetrics and Gynecology

## 2021-05-18 ENCOUNTER — Encounter: Payer: Self-pay | Admitting: Obstetrics and Gynecology

## 2021-05-18 ENCOUNTER — Other Ambulatory Visit: Payer: Self-pay

## 2021-05-18 VITALS — BP 140/90 | Ht 63.0 in | Wt 300.2 lb

## 2021-05-18 DIAGNOSIS — Z30011 Encounter for initial prescription of contraceptive pills: Secondary | ICD-10-CM

## 2021-05-18 DIAGNOSIS — N926 Irregular menstruation, unspecified: Secondary | ICD-10-CM

## 2021-05-18 DIAGNOSIS — N912 Amenorrhea, unspecified: Secondary | ICD-10-CM | POA: Diagnosis not present

## 2021-05-18 MED ORDER — NORETHINDRONE 0.35 MG PO TABS: 1.0000 | ORAL_TABLET | Freq: Every day | ORAL | 11 refills | Status: DC

## 2021-05-18 NOTE — Progress Notes (Signed)
Patient ID: Jordan David, female   DOB: 1996-03-11, 25 y.o.   MRN: [id number]  Reason for Consult: Follow-up   Referred by Sandrea Hughs, NP  Subjective:     HPI:  Jordan David is a 25 y.o. female she is following up today regarding amenorrhea. She reports having 4 periods a year. She has tried metformin in the past but it upset her stomach. She has been on spironolactone for several years. Recently she discontinued this medication because she did not have a refill. She reports in the past her testosterone levels were higher and her labs last time showed a decrease. She however did not seen an improvement in her facial hair growth with that medication.   Ultimately she would like to conceive. She does have a partner of 3 years and they are considering trying to become pregnant.   She has tried in the past to manage her weight. She has been on phentermine, but with discontinuation regained weight. 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 She feels active with the kids. She reports walking [phone] steps a day.   Gynecological History  Patient's last menstrual period was 05/11/2021.   Past Medical History:  Diagnosis Date   Allergy    Anxiety    controlled with medication   Asthma    Back pain    scoliosis and dislocated disc (>1 year of pain)   Depression    controlled with medication   Lumbar disc disorder    Stomach ulcer 2015   History reviewed. No pertinent family history. Past Surgical History:  Procedure Laterality Date   ESOPHAGOGASTRODUODENOSCOPY (EGD) WITH PROPOFOL N/A 05/10/2017   Procedure: ESOPHAGOGASTRODUODENOSCOPY (EGD) WITH PROPOFOL;  Surgeon: Toledo, Boykin Nearing, MD;  Location: [facility]COPY;  Service: Gastroenterology;  Laterality: N/A;   FLEXIBLE SIGMOIDOSCOPY N/A 05/10/2017   Procedure: FLEXIBLE SIGMOIDOSCOPY;  Surgeon: Toledo, Boykin Nearing, MD;  Location: [facility];  Service: Gastroenterology;  Laterality: N/A;     Short Social History:  Social History   Tobacco Use   Smoking status: Never   Smokeless tobacco: Never  Substance Use Topics   Alcohol use: No    No Known Allergies  Current Outpatient Medications  Medication Sig Dispense Refill   albuterol (PROVENTIL HFA;VENTOLIN HFA) 108 (90 Base) MCG/ACT inhaler Inhale as needed into the lungs for wheezing or shortness of breath.     clindamycin (CLINDAGEL) 1 % gel Apply topically 2 (two) times daily. 30 g 11   metroNIDAZOLE (FLAGYL) 500 MG tablet Take 1 tablet (500 mg total) by mouth 2 (two) times daily. 14 tablet 0   norethindrone (MICRONOR) 0.35 MG tablet Take 1 tablet (0.35 mg total) by mouth daily. 28 tablet 11   phentermine (ADIPEX-P) 37.5 MG tablet Take 37.5 mg daily before breakfast by mouth.     brompheniramine-pseudoephedrine-DM 30-2-10 MG/5ML syrup Take 5 mLs by mouth 4 (four) times daily as needed. (Patient not taking: Reported on 04/15/2021) 120 mL 0   buPROPion (WELLBUTRIN XL) 300 MG 24 hr tablet Take 300 mg by mouth every morning. (Patient not taking: Reported on 04/15/2021)  1   cholecalciferol (VITAMIN D) 400 units TABS tablet Take 800 Units by mouth. (Patient not taking: Reported on 04/15/2021)     fluticasone (FLONASE) 50 MCG/ACT nasal spray Place 1 spray into both nostrils daily. 16 g 0   mirtazapine (REMERON) 30 MG tablet Take 30 mg by mouth at bedtime. (Patient not taking: Reported on 04/15/2021)  1   sertraline (ZOLOFT) 100 MG tablet Take 100 mg daily by mouth. (Patient not taking: Reported on 04/15/2021)     Current Facility-Administered Medications  Medication Dose Route Frequency Provider Last Rate Last Admin   betamethasone acetate-betamethasone sodium phosphate (CELESTONE) injection 3 mg  3 mg Intramuscular Once Edrick Kins, DPM        Review of Systems  Constitutional: Negative for chills, fatigue, fever and unexpected weight change.  HENT: Negative for trouble swallowing.  Eyes: Negative for loss of vision.   Respiratory: Negative for cough, shortness of breath and wheezing.  Cardiovascular: Negative for chest pain, leg swelling, palpitations and syncope.  GI: Negative for abdominal pain, blood in stool, diarrhea, nausea and vomiting.  GU: Negative for difficulty urinating, dysuria, frequency and hematuria.  Musculoskeletal: Negative for back pain, leg pain and joint pain.  Skin: Negative for rash.  Neurological: Negative for dizziness, headaches, light-headedness, numbness and seizures.  Psychiatric: Negative for behavioral problem, confusion, depressed mood and sleep disturbance.       Objective:  Objective   Vitals:   05/18/21 1603 05/18/21 1617  BP: (!) 160/90 140/80  Weight: (!) 300 lb 3.2 oz (136.2 kg)   Height: 5\' 3"  (1.6 m)    Body mass index is 53.18 kg/m.  Physical Exam Vitals and nursing note reviewed. Exam conducted with a chaperone present.  Constitutional:      Appearance: Normal appearance.  HENT:     Head: Normocephalic and atraumatic.  Eyes:     Extraocular Movements: Extraocular movements intact.     Pupils: Pupils are equal, round, and reactive to light.  Cardiovascular:     Rate and Rhythm: Normal rate and regular rhythm.  Pulmonary:     Effort: Pulmonary effort is normal.     Breath sounds: Normal breath sounds.  Abdominal:     General: Abdomen is flat.     Palpations: Abdomen is soft.  Musculoskeletal:     Cervical back: Normal range of motion.  Skin:    General: Skin is warm and dry.  Neurological:     General: No focal deficit present.     Mental Status: She is alert and oriented to person, place, and time.  Psychiatric:        Behavior: Behavior normal.        Thought Content: Thought content normal.        Judgment: Judgment normal.    Assessment/Plan:    25 yo with irregular menstrual cycles, secondary to PCOS Discussed PCOS disease and increased risks.  Discussed lifestyle interventions.  Patient desires to plan a sleep study soon.   She had elevated Bps today which might be related to discontinuing spironolactone. Since she desires to conceive will not restart this medications. She plans to follow up with her PCP next week regarding blood pressures.  Will start POP to help regulate menstrual pattern.  Discussed letrozole/clomid ovulation induction in the future when she desires to conceive.  Encouraged to try and improve BMI to 45 or less to minimize pregnancy risks such as gestational diabetes, preeclampsia, and fetal growth disorders.  Declines referral to weight loss management at this time since she would have difficulty going to visits.  Encouraged her to slowly increase activity from 1,200 steps to 8,00-10,000 steps daily.  Discussed ASK PCOS app for more information.  Given ACLM resources at her previous visit.     Adrian Prows MD [facility], [facility] 05/18/2021 5:51 PM

## 2021-06-27 ENCOUNTER — Encounter: Payer: Self-pay | Admitting: Obstetrics and Gynecology

## 2021-07-01 ENCOUNTER — Ambulatory Visit: Payer: BC Managed Care – PPO | Admitting: Obstetrics and Gynecology

## 2021-07-16 ENCOUNTER — Ambulatory Visit: Payer: BC Managed Care – PPO | Admitting: Dietician

## 2021-07-21 ENCOUNTER — Ambulatory Visit (LOCAL_COMMUNITY_HEALTH_CENTER): Payer: Self-pay

## 2021-07-21 ENCOUNTER — Other Ambulatory Visit: Payer: Self-pay

## 2021-07-21 DIAGNOSIS — Z23 Encounter for immunization: Secondary | ICD-10-CM

## 2021-07-21 DIAGNOSIS — Z719 Counseling, unspecified: Secondary | ICD-10-CM

## 2021-07-21 NOTE — Progress Notes (Signed)
Patient in nurse clinic for Influenza vaccine. Vaccine administered and patient tolerated well. [facility] updated and copy given to patient. Ranae Palms, RN

## 2021-08-14 ENCOUNTER — Ambulatory Visit: Payer: BC Managed Care – PPO | Admitting: Obstetrics and Gynecology

## 2021-10-11 ENCOUNTER — Emergency Department: Payer: Self-pay

## 2021-10-11 ENCOUNTER — Other Ambulatory Visit: Payer: Self-pay

## 2021-10-11 ENCOUNTER — Encounter: Payer: Self-pay | Admitting: Emergency Medicine

## 2021-10-11 ENCOUNTER — Emergency Department
Admission: EM | Admit: 2021-10-11 | Discharge: 2021-10-11 | Disposition: A | Discharge status: Home / Self Care | Payer: Self-pay | Attending: Emergency Medicine | Admitting: Emergency Medicine

## 2021-10-11 DIAGNOSIS — M79604 Pain in right leg: Secondary | ICD-10-CM | POA: Insufficient documentation

## 2021-10-11 MED ORDER — NAPROXEN 500 MG PO TABS
500.0000 mg | ORAL_TABLET | Freq: Once | ORAL | Status: AC
  Administered 2021-10-11: 500 mg via ORAL
  Filled 2021-10-11: qty 1

## 2021-10-11 NOTE — ED Provider Notes (Signed)
? ?[facility] ?Provider Note ? ? ? Event Date/Time  ? First MD Initiated Contact with Patient 10/11/21 0300   ?  (approximate) ? ? ?History  ? ?Knee Pain ? ? ?HPI ? ?Jordan David is a 26 y.o. female with no significant past medical history who comes ED complaining of right knee pain radiating down toward the right foot for the past week.  Intermittent, no aggravating or alleviating factors.  No paresthesia or motor weakness.  No trauma.  No history of DVT, no recent history of surgery, travel, trauma, injury.  No chest pain or shortness of breath.  No fever.  No rash. ? ?She does report that she has chronic mild swelling of the right leg which is asymmetric to the left. ?  ? ? ?Physical Exam  ? ?Triage Vital Signs: ?ED Triage Vitals  ?Enc Vitals Group  ?   BP 10/11/21 0211 123/83  ?   Pulse Rate 10/11/21 0211 83  ?   Resp 10/11/21 0211 16  ?   Temp 10/11/21 0211 98.5 ?F (36.9 ?C)  ?   Temp Source 10/11/21 0211 Oral  ?   SpO2 10/11/21 0211 99 %  ?   Weight 10/11/21 0212 300 lb (136.1 kg)  ?   Height 10/11/21 0212 5\' 3"  (1.6 m)  ?   Head Circumference --   ?   Peak Flow --   ?   Pain Score 10/11/21 0212 5  ?   Pain Loc --   ?   Pain Edu? --   ?   Excl. in [place]? --   ? ? ?Most recent vital signs: ?Vitals:  ? 10/11/21 0211 10/11/21 0304  ?BP: 123/83 128/66  ?Pulse: 83 89  ?Resp: 16 14  ?Temp: 98.5 ?F (36.9 ?C)   ?SpO2: 99% 100%  ? ? ? ?General: Awake, no distress.  ?CV:  Good peripheral perfusion.  Regular rate and rhythm ?Resp:  Normal effort.  Clear to auscultation bilaterally ?Abd:  No distention.  Soft and nontender ?Other:  Right leg nontender, no palpable cord or mass, knee is stable, no bony tenderness or deformity.  Right calf circumference is about 2 cm greater than left calf circumference. ? ? ?ED Results / Procedures / Treatments  ? ?Labs ?(all labs ordered are listed, but only abnormal results are displayed) ?Labs Reviewed - No data to display ? ? ?EKG ? ? ? ? ?RADIOLOGY ?X-ray  right knee viewed and interpreted by me, negative for fracture.  Radiology report reviewed. ? ?Ultrasound right lower extremity viewed and interpreted by me, negative for DVT.  Radiology report reviewed. ? ? ? ?PROCEDURES: ? ?Critical Care performed: No ? ?Procedures ? ? ?MEDICATIONS ORDERED IN ED: ?Medications  ?naproxen (NAPROSYN) tablet 500 mg (500 mg Oral Given 10/11/21 0439)  ? ? ? ?IMPRESSION / MDM / ASSESSMENT AND PLAN / ED COURSE  ?I reviewed the triage vital signs and the nursing notes. ?             ?               ? ?Differential diagnosis includes, but is not limited to, fracture, DVT, muscle cramp, muscle strain, sprain ? ? ? ?Patient complains of right knee pain, intermittent and nonspecific.  See denies signs of trauma.  Low risk for DVT.  Vital signs are normal, exam is reassuring other than asymmetric calf circumference.  X-ray of the knee is unremarkable, ultrasound of the right leg is negative  for DVT.  Recommend NSAIDs, heat therapy, follow-up with PCP for reassessment.  Does not require admission  ?  ? ? ?FINAL CLINICAL IMPRESSION(S) / ED DIAGNOSES  ? ?Final diagnoses:  ?Right leg pain  ? ? ? ?Rx / DC Orders  ? ?ED Discharge Orders   ? ? None  ? ?  ? ? ? ?Note:  This document was prepared using Dragon voice recognition software and may include unintentional dictation errors. ?  ?Sharman Cheek, MD ?10/11/21 [phone] ? ?

## 2021-10-11 NOTE — ED Triage Notes (Signed)
Pt presents to triage room ambulatory with steady gait. Reports she has knee pain and shooting pains to her right foot. Pt reports pain started about 7 days ago  Pt denies any other symptom ?

## 2021-10-11 NOTE — ED Notes (Signed)
Pt denies injury.

## 2021-10-11 NOTE — ED Notes (Signed)
Pt states rt knee began hurting intermittently last week.  Initially felt like a cramp and now more like a "throbbing, aching sensation."  States that today the pain has been more consistent but not worse.  Pain level at this time is a 6, aching, throbbing. ?

## 2021-10-11 NOTE — Discharge Instructions (Signed)
Your x-ray and ultrasound of the right leg are unremarkable.  No sign of any fracture or blood clot in the leg.  Take anti-inflammatory medicine such as ibuprofen or Aleve as needed over the next week, apply heat to the painful area off-and-on, and follow-up with primary care for reassessment. ?

## 2021-12-10 ENCOUNTER — Telehealth: Payer: Self-pay | Admitting: Family Medicine

## 2021-12-10 NOTE — Telephone Encounter (Signed)
I attempted to reach patient via phone to scheduled for birth control follow up. Patient had questions about being prescribed Clomid from passed appointment with [facility]. I wasn't able to reach patient phone on file no answer, no voicemail available.

## 2021-12-11 NOTE — Telephone Encounter (Signed)
Jordan David, this is Jordan David with [facility] trying to reach you to scheduled an office visit appointment. Please contact our office so we can get this scheduled for you! Thank you! [phone]

## 2022-03-04 ENCOUNTER — Ambulatory Visit: Payer: BC Managed Care – PPO | Admitting: Family Medicine

## 2022-08-16 ENCOUNTER — Emergency Department
Admission: EM | Admit: 2022-08-16 | Discharge: 2022-08-17 | Disposition: A | Discharge status: Home / Self Care | Payer: BC Managed Care – PPO | Attending: Emergency Medicine | Admitting: Emergency Medicine

## 2022-08-16 ENCOUNTER — Emergency Department: Payer: BC Managed Care – PPO

## 2022-08-16 ENCOUNTER — Encounter: Payer: Self-pay | Admitting: *Deleted

## 2022-08-16 ENCOUNTER — Other Ambulatory Visit: Payer: Self-pay

## 2022-08-16 DIAGNOSIS — R0789 Other chest pain: Secondary | ICD-10-CM | POA: Insufficient documentation

## 2022-08-16 LAB — CBC
HCT: 42.5 % (ref 36.0–46.0)
Hemoglobin: 13.7 g/dL (ref 12.0–15.0)
MCH: 27.7 pg (ref 26.0–34.0)
MCHC: 32.2 g/dL (ref 30.0–36.0)
MCV: 86 fL (ref 80.0–100.0)
Platelets: 376 10*3/uL (ref 150–400)
RBC: 4.94 MIL/uL (ref 3.87–5.11)
RDW: 12.6 % (ref 11.5–15.5)
WBC: 10.4 10*3/uL (ref 4.0–10.5)
nRBC: 0 % (ref 0.0–0.2)

## 2022-08-16 LAB — BASIC METABOLIC PANEL WITH GFR
Anion gap: 9 (ref 5–15)
BUN: 15 mg/dL (ref 6–20)
CO2: 27 mmol/L (ref 22–32)
Calcium: 9.3 mg/dL (ref 8.9–10.3)
Chloride: 102 mmol/L (ref 98–111)
Creatinine, Ser: 0.76 mg/dL (ref 0.44–1.00)
GFR, Estimated: >60 mL/min
Glucose, Bld: 106 mg/dL — ABNORMAL HIGH (ref 70–99)
Potassium: 4 mmol/L (ref 3.5–5.1)
Sodium: 138 mmol/L (ref 135–145)

## 2022-08-16 LAB — TROPONIN I (HIGH SENSITIVITY)
Troponin I (High Sensitivity): 3 ng/L (ref <18)
Troponin I (High Sensitivity): 3 ng/L (ref <18)

## 2022-08-16 LAB — POC URINE PREG, ED: Preg Test, Ur: NEGATIVE

## 2022-08-16 MED ORDER — LORAZEPAM 1 MG PO TABS
1.0000 mg | ORAL_TABLET | Freq: Once | ORAL | Status: AC
Start: 2022-08-16 — End: 2022-08-16
  Administered 2022-08-16: 1 mg via ORAL
  Filled 2022-08-16: qty 1

## 2022-08-16 NOTE — ED Provider Notes (Signed)
[facility] Provider Note  Patient Contact: 10:16 PM (approximate)   History   Chest Pain   HPI  Jordan David is a 27 y.o. female presents to the emergency department with right-sided chest pain that radiates to the shoulder.  Patient states that she has never had chest pain like this in the past.  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  She states that she has been under tremendous stress at home as her mom does get out of ICU and her dad has dementia and her boyfriend recently lost his job.  She denies current chest tightness or shortness of breath.  No nausea, vomiting or abdominal pain.      Physical Exam   Triage Vital Signs: ED Triage Vitals  Enc Vitals Group     BP 08/16/22 1910 (!) 141/71     Pulse Rate 08/16/22 1910 88     Resp 08/16/22 1910 18     Temp 08/16/22 1910 98.6 F (37 C)     Temp Source 08/16/22 1910 Oral     SpO2 08/16/22 1910 98 %     Weight 08/16/22 1907 280 lb (127 kg)     Height 08/16/22 1907 5' 3"$  (1.6 m)     Head Circumference --      Peak Flow --      Pain Score 08/16/22 1907 5     Pain Loc --      Pain Edu? --      Excl. in [place]? --     Most recent vital signs: Vitals:   08/16/22 1910  BP: (!) 141/71  Pulse: 88  Resp: 18  Temp: 98.6 F (37 C)  SpO2: 98%     General: Alert and in no acute distress. Eyes:  PERRL. EOMI. Head: No acute traumatic findings ENT:      Nose: No congestion/rhinnorhea.      Mouth/Throat: Mucous membranes are moist. Neck: No stridor. No cervical spine tenderness to palpation.  Cardiovascular:  Good peripheral perfusion Respiratory: Normal respiratory effort without tachypnea or retractions. Lungs CTAB. Good air entry to the bases with no decreased or absent breath sounds. Gastrointestinal: Bowel sounds 4 quadrants. Soft and nontender to palpation. No guarding or  rigidity. No palpable masses. No distention. No CVA tenderness. Musculoskeletal: Full range of motion to all extremities.  Neurologic:  No gross focal neurologic deficits are appreciated.  Skin:   No rash noted Other:  ED Results / Procedures / Treatments   Labs (all labs ordered are listed, but only abnormal results are displayed) Labs Reviewed  BASIC METABOLIC PANEL - Abnormal; Notable for the following components:      Result Value   Glucose, Bld 106 (*)    All other components within normal limits  CBC  POC URINE PREG, ED  TROPONIN I (HIGH SENSITIVITY)  TROPONIN I (HIGH SENSITIVITY)     EKG  Normal sinus rhythm without ST segment elevation or other apparent arrhythmia.   RADIOLOGY  I personally viewed and evaluated these images as part of my medical decision making, as well as reviewing the written report by the radiologist.  ED Provider Interpretation: No acute abnormality on chest x-ray.   PROCEDURES:  Critical Care performed: No  Procedures   MEDICATIONS ORDERED IN ED: Medications - No data to display   IMPRESSION / MDM / ASSESSMENT AND PLAN /  ED COURSE  I reviewed the triage vital signs and the nursing notes.                              Assessment and plan Chest pain 27 year old female presents to the emergency department with right-sided chest pain that radiated to her right shoulder.  Vital signs reassuring at triage.  On exam, patient was alert, active and nontoxic-appearing.  EKG indicated normal sinus rhythm without ST segment elevation or other apparent arrhythmia  CBC, BMP and delta troponin within range.  Urine pregnancy test was negative.  Patient reported that all of her symptoms resolved after 1 mg of Ativan.  I do suspect that a component of patient's chest pain is associated with tremendous stress that she has in her life right now.  We did talk about managing stress at home.  Overall, upon recheck, patient felt improved and felt ready  to go home.  Return precautions were given to return with new or worsening symptoms     FINAL CLINICAL IMPRESSION(S) / ED DIAGNOSES   Final diagnoses:  None     Rx / DC Orders   ED Discharge Orders     None        Note:  This document was prepared using Dragon voice recognition software and may include unintentional dictation errors.   Vallarie Mare Ola, PA-C 08/16/22 2359    Arta Silence, MD 08/17/22 0020

## 2022-08-16 NOTE — ED Triage Notes (Signed)
Pt reports chest pain since yesterday.  Pt reports sob.  No cough.   No fever.  No n/v/d  pt alert speech clear.

## 2022-10-29 ENCOUNTER — Encounter: Payer: Self-pay | Admitting: Obstetrics and Gynecology

## 2022-11-01 NOTE — Progress Notes (Deleted)
GYNECOLOGY ANNUAL PHYSICAL EXAM PROGRESS NOTE  Subjective:    Jordan David is a 27 y.o. No obstetric history on file. female who presents for an annual exam. The patient has no complaints today. The patient {is/is not/has never been:13135} sexually active. The patient participates in regular exercise: {yes/no/not asked:9010}. Has the patient ever been transfused or tattooed?: {yes/no/not asked:9010}. The patient reports that there {is/is not:9024} domestic violence in her life.    Menstrual History: Menarche age: *** No LMP recorded. (Menstrual status: Irregular Periods).     Gynecologic History:  Contraception: OCP (estrogen/progesterone) History of STI's:  Last Pap: 04/15/2021. Results were: normal.  Denies h/o abnormal pap smears. Last mammogram: Not age appropriate       OB History  No obstetric history on file.    Past Medical History:  Diagnosis Date   Allergy    Anxiety    controlled with medication   Asthma    Back pain    scoliosis and dislocated disc (>1 year of pain)   Depression    controlled with medication   Lumbar disc disorder    Stomach ulcer 2015    Past Surgical History:  Procedure Laterality Date   ESOPHAGOGASTRODUODENOSCOPY (EGD) WITH PROPOFOL N/A 05/10/2017   Procedure: ESOPHAGOGASTRODUODENOSCOPY (EGD) WITH PROPOFOL;  Surgeon: Toledo, Boykin Nearing, MD;  Location: [facility] ENDOSCOPY;  Service: Gastroenterology;  Laterality: N/A;   FLEXIBLE SIGMOIDOSCOPY N/A 05/10/2017   Procedure: FLEXIBLE SIGMOIDOSCOPY;  Surgeon: Toledo, Boykin Nearing, MD;  Location: [facility] ENDOSCOPY;  Service: Gastroenterology;  Laterality: N/A;    No family history on file.  Social History   Socioeconomic History   Marital status: Significant Other    Spouse name: Not on file   Number of children: Not on file   Years of education: Not on file   Highest education level: Not on file  Occupational History   Not on file  Tobacco Use   Smoking status: Never   Smokeless  tobacco: Never  Vaping Use   Vaping Use: Some days  Substance and Sexual Activity   Alcohol use: No   Drug use: Never   Sexual activity: Yes    Partners: Male    Birth control/protection: None  Other Topics Concern   Not on file  Social History Narrative   Not on file   Social Determinants of Health   Financial Resource Strain: Not on file  Food Insecurity: Not on file  Transportation Needs: Not on file  Physical Activity: Not on file  Stress: Not on file  Social Connections: Not on file  Intimate Partner Violence: Not on file    Current Outpatient Medications on File Prior to Visit  Medication Sig Dispense Refill   albuterol (PROVENTIL HFA;VENTOLIN HFA) 108 (90 Base) MCG/ACT inhaler Inhale as needed into the lungs for wheezing or shortness of breath.     brompheniramine-pseudoephedrine-DM 30-2-10 MG/5ML syrup Take 5 mLs by mouth 4 (four) times daily as needed. (Patient not taking: Reported on 04/15/2021) 120 mL 0   buPROPion (WELLBUTRIN XL) 300 MG 24 hr tablet Take 300 mg by mouth every morning. (Patient not taking: Reported on 04/15/2021)  1   cholecalciferol (VITAMIN D) 400 units TABS tablet Take 800 Units by mouth. (Patient not taking: Reported on 04/15/2021)     clindamycin (CLINDAGEL) 1 % gel Apply topically 2 (two) times daily. 30 g 11   fluticasone (FLONASE) 50 MCG/ACT nasal spray Place 1 spray into both nostrils daily. 16 g 0   metroNIDAZOLE (FLAGYL)  500 MG tablet Take 1 tablet (500 mg total) by mouth 2 (two) times daily. 14 tablet 0   mirtazapine (REMERON) 30 MG tablet Take 30 mg by mouth at bedtime. (Patient not taking: Reported on 04/15/2021)  1   norethindrone (MICRONOR) 0.35 MG tablet Take 1 tablet (0.35 mg total) by mouth daily. 28 tablet 11   phentermine (ADIPEX-P) 37.5 MG tablet Take 37.5 mg daily before breakfast by mouth.     sertraline (ZOLOFT) 100 MG tablet Take 100 mg daily by mouth. (Patient not taking: Reported on 04/15/2021)     Current  Facility-Administered Medications on File Prior to Visit  Medication Dose Route Frequency Provider Last Rate Last Admin   betamethasone acetate-betamethasone sodium phosphate (CELESTONE) injection 3 mg  3 mg Intramuscular Once Felecia Shelling, DPM        No Known Allergies   Review of Systems Constitutional: negative for chills, fatigue, fevers and sweats Eyes: negative for irritation, redness and visual disturbance Ears, nose, mouth, throat, and face: negative for hearing loss, nasal congestion, snoring and tinnitus Respiratory: negative for asthma, cough, sputum Cardiovascular: negative for chest pain, dyspnea, exertional chest pressure/discomfort, irregular heart beat, palpitations and syncope Gastrointestinal: negative for abdominal pain, change in bowel habits, nausea and vomiting Genitourinary: negative for abnormal menstrual periods, genital lesions, sexual problems and vaginal discharge, dysuria and urinary incontinence Integument/breast: negative for breast lump, breast tenderness and nipple discharge Hematologic/lymphatic: negative for bleeding and easy bruising Musculoskeletal:negative for back pain and muscle weakness Neurological: negative for dizziness, headaches, vertigo and weakness Endocrine: negative for diabetic symptoms including polydipsia, polyuria and skin dryness Allergic/Immunologic: negative for hay fever and urticaria      Objective:  There were no vitals taken for this visit. There is no height or weight on file to calculate BMI.    General Appearance:    Alert, cooperative, no distress, appears stated age  Head:    Normocephalic, without obvious abnormality, atraumatic  Eyes:    PERRL, conjunctiva/corneas clear, EOM's intact, both eyes  Ears:    Normal external ear canals, both ears  Nose:   Nares normal, septum midline, mucosa normal, no drainage or sinus tenderness  Throat:   Lips, mucosa, and tongue normal; teeth and gums normal  Neck:   Supple,  symmetrical, trachea midline, no adenopathy; thyroid: no enlargement/tenderness/nodules; no carotid bruit or JVD  Back:     Symmetric, no curvature, ROM normal, no CVA tenderness  Lungs:     Clear to auscultation bilaterally, respirations unlabored  Chest Wall:    No tenderness or deformity   Heart:    Regular rate and rhythm, S1 and S2 normal, no murmur, rub or gallop  Breast Exam:    No tenderness, masses, or nipple abnormality  Abdomen:     Soft, non-tender, bowel sounds active all four quadrants, no masses, no organomegaly.    Genitalia:    Pelvic:external genitalia normal, vagina without lesions, discharge, or tenderness, rectovaginal septum  normal. Cervix normal in appearance, no cervical motion tenderness, no adnexal masses or tenderness.  Uterus normal size, shape, mobile, regular contours, nontender.  Rectal:    Normal external sphincter.  No hemorrhoids appreciated. Internal exam not done.   Extremities:   Extremities normal, atraumatic, no cyanosis or edema  Pulses:   2+ and symmetric all extremities  Skin:   Skin color, texture, turgor normal, no rashes or lesions  Lymph nodes:   Cervical, supraclavicular, and axillary nodes normal  Neurologic:   CNII-XII intact, normal strength,  sensation and reflexes throughout   .  Labs:  Lab Results  Component Value Date   WBC 10.4 08/16/2022   HGB 13.7 08/16/2022   HCT 42.5 08/16/2022   MCV 86.0 08/16/2022   PLT 376 08/16/2022    Lab Results  Component Value Date   CREATININE 0.76 08/16/2022   BUN 15 08/16/2022   NA 138 08/16/2022   K 4.0 08/16/2022   CL 102 08/16/2022   CO2 27 08/16/2022    Lab Results  Component Value Date   ALT 25 03/22/2016   AST 19 03/22/2016   ALKPHOS 52 03/22/2016   BILITOT 0.6 03/22/2016    Lab Results  Component Value Date   TSH 1.600 04/15/2021     Assessment:   No diagnosis found.   Plan:  Blood tests: {blood tests:13147}. Breast self exam technique reviewed and patient encouraged  to perform self-exam monthly. Contraception: OCP (estrogen/progesterone). Discussed healthy lifestyle modifications. Mammogram  : Not age appropriate Pap smear  UTD . COVID vaccination status: Follow up in 1 year for annual exam   Hildred Laser, MD [facility]

## 2022-11-02 ENCOUNTER — Ambulatory Visit: Payer: BC Managed Care – PPO | Admitting: Obstetrics and Gynecology

## 2023-02-14 ENCOUNTER — Encounter: Payer: Self-pay | Admitting: Emergency Medicine

## 2023-02-14 ENCOUNTER — Other Ambulatory Visit: Payer: Self-pay

## 2023-02-14 DIAGNOSIS — Z20822 Contact with and (suspected) exposure to covid-19: Secondary | ICD-10-CM | POA: Insufficient documentation

## 2023-02-14 DIAGNOSIS — J45909 Unspecified asthma, uncomplicated: Secondary | ICD-10-CM | POA: Insufficient documentation

## 2023-02-14 DIAGNOSIS — J029 Acute pharyngitis, unspecified: Secondary | ICD-10-CM | POA: Diagnosis present

## 2023-02-14 DIAGNOSIS — J069 Acute upper respiratory infection, unspecified: Secondary | ICD-10-CM | POA: Diagnosis not present

## 2023-02-14 LAB — GROUP A STREP BY PCR: Group A Strep by PCR: NOT DETECTED

## 2023-02-14 LAB — SARS CORONAVIRUS 2 BY RT PCR: SARS Coronavirus 2 by RT PCR: NEGATIVE

## 2023-02-14 NOTE — ED Triage Notes (Signed)
Patient ambulatory to triage with steady gait, without difficulty or distress noted; pt reports since Saturday having sore throat, congestion, and feeling tired

## 2023-02-15 ENCOUNTER — Emergency Department
Admission: EM | Admit: 2023-02-15 | Discharge: 2023-02-15 | Disposition: A | Discharge status: Home / Self Care | Payer: BC Managed Care – PPO | Attending: Emergency Medicine | Admitting: Emergency Medicine

## 2023-02-15 DIAGNOSIS — J069 Acute upper respiratory infection, unspecified: Secondary | ICD-10-CM | POA: Diagnosis not present

## 2023-02-15 MED ORDER — ACETAMINOPHEN 500 MG PO TABS
1000.0000 mg | ORAL_TABLET | Freq: Once | ORAL | Status: AC
  Administered 2023-02-15: 1000 mg via ORAL
  Filled 2023-02-15: qty 2

## 2023-02-15 MED ORDER — OXYMETAZOLINE HCL 0.05 % NA SOLN
1.0000 | Freq: Once | NASAL | Status: AC
  Administered 2023-02-15: 1 via NASAL
  Filled 2023-02-15: qty 30

## 2023-02-15 MED ORDER — IBUPROFEN 600 MG PO TABS
600.0000 mg | ORAL_TABLET | Freq: Once | ORAL | Status: AC
Start: 2023-02-15 — End: 2023-02-15
  Administered 2023-02-15: 600 mg via ORAL
  Filled 2023-02-15: qty 1

## 2023-02-15 NOTE — Discharge Instructions (Signed)
For your nasal congestion, go to your local pharmacy and look for a nasal irrigation device which can help clear your sinuses.  If you use the Afrin nasal spray, do not use for more than 3 days as the rebound effects can make your congestion worse after stopping.  Take acetaminophen 650 mg and ibuprofen 400 mg every 6 hours for pain.  Take with food.   Thank you for choosing [place] for your health care today!  Please see your primary doctor this week for a follow up appointment.   If you have any new, worsening, or unexpected symptoms call your doctor right away or come back to the emergency department for reevaluation.  It was my pleasure to care for you today.   Daneil Dan [place] Charon, MD

## 2023-02-15 NOTE — ED Provider Notes (Signed)
[facility] Provider Note    Event Date/Time   First MD Initiated Contact with Patient 02/15/23 0159     (approximate)   History   Sore Throat   HPI  Jordan David is a 27 y.o. female   Past medical history of anxiety, asthma, who presents to the Emergency Department with nasal congestion, cough, sore throat for the last several days.  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  She denies fever, shortness of breath, chest pain.  She denies any GI or GU complaints.  She has been taking DayQuil with moderate relief.     Physical Exam   Triage Vital Signs: ED Triage Vitals [02/14/23 2309]  Encounter Vitals Group     BP (!) 138/91     Systolic BP Percentile      Diastolic BP Percentile      Pulse Rate 84     Resp 18     Temp 98.4 F (36.9 C)     Temp Source Oral     SpO2 100 %     Weight 285 lb (129.3 kg)     Height 5\' 3"  (1.6 m)     Head Circumference      Peak Flow      Pain Score 4     Pain Loc      Pain Education      Exclude from Growth Chart     Most recent vital signs: Vitals:   02/14/23 2309  BP: (!) 138/91  Pulse: 84  Resp: 18  Temp: 98.4 F (36.9 C)  SpO2: 100%    General: Awake, no distress.  CV:  Good peripheral perfusion.  Resp:  Normal effort.  Abd:  No distention.  Other:  Awake alert comfortable nontoxic-appearing with normal vital signs, afebrile, supple neck with full range of motion, TMs appear noninfected, no exudates or masses to posterior oropharynx, breathing comfortably, lungs clear, she has nasal congestion.   ED Results / Procedures / Treatments   Labs (all labs ordered are listed, but only abnormal results are displayed) Labs Reviewed  GROUP A STREP BY PCR  SARS CORONAVIRUS 2 BY RT PCR     I ordered and reviewed the above labs they are notable for strep and COVID-negative   PROCEDURES:  Critical Care performed: No  Procedures   MEDICATIONS  ORDERED IN ED: Medications  oxymetazoline (AFRIN) 0.05 % nasal spray 1 spray (has no administration in time range)  acetaminophen (TYLENOL) tablet 1,000 mg (has no administration in time range)  ibuprofen (ADVIL) tablet 600 mg (has no administration in time range)   IMPRESSION / MDM / ASSESSMENT AND PLAN / ED COURSE  I reviewed the triage vital signs and the nursing notes.                                Patient's presentation is most consistent with acute presentation with potential threat to life or bodily function.  Differential diagnosis includes, but is not limited to, viral URI, bacterial pneumonia, deep space neck infection, peritonsillar abscess   The patient is on the cardiac monitor to evaluate for evidence of arrhythmia and/or significant heart rate changes.  MDM:    Patient looks well with viral URI symptoms.  Negative COVID and strep.  No signs of respiratory distress, nontoxic appearance doubt sepsis, meningitis, displaced neck infection, no masses to suggest abscess  of the throat, anticipatory guidance given for viral URI symptoms and she will follow-up with PMD.       FINAL CLINICAL IMPRESSION(S) / ED DIAGNOSES   Final diagnoses:  Viral URI with cough     Rx / DC Orders   ED Discharge Orders     None        Note:  This document was prepared using Dragon voice recognition software and may include unintentional dictation errors.    Pilar Jarvis, MD 02/15/23 [phone]

## 2023-03-17 ENCOUNTER — Encounter: Payer: Self-pay | Admitting: Obstetrics and Gynecology

## 2023-03-17 ENCOUNTER — Ambulatory Visit (INDEPENDENT_AMBULATORY_CARE_PROVIDER_SITE_OTHER): Payer: BC Managed Care – PPO | Admitting: Obstetrics and Gynecology

## 2023-03-17 ENCOUNTER — Other Ambulatory Visit (HOSPITAL_COMMUNITY)
Admission: RE | Admit: 2023-03-17 | Discharge: 2023-03-17 | Disposition: A | Discharge status: Home / Self Care | Payer: BC Managed Care – PPO | Source: Ambulatory Visit | Attending: Obstetrics and Gynecology | Admitting: Obstetrics and Gynecology

## 2023-03-17 VITALS — BP 140/80 | HR 83 | Ht 63.0 in | Wt 309.0 lb

## 2023-03-17 DIAGNOSIS — E282 Polycystic ovarian syndrome: Secondary | ICD-10-CM

## 2023-03-17 DIAGNOSIS — Z01419 Encounter for gynecological examination (general) (routine) without abnormal findings: Secondary | ICD-10-CM | POA: Diagnosis present

## 2023-03-17 DIAGNOSIS — N970 Female infertility associated with anovulation: Secondary | ICD-10-CM

## 2023-03-17 DIAGNOSIS — N926 Irregular menstruation, unspecified: Secondary | ICD-10-CM

## 2023-03-17 DIAGNOSIS — R7303 Prediabetes: Secondary | ICD-10-CM

## 2023-03-17 MED ORDER — MEDROXYPROGESTERONE ACETATE 10 MG PO TABS: 10.0000 mg | ORAL_TABLET | Freq: Every day | ORAL | 2 refills | Status: AC

## 2023-03-17 MED ORDER — PHENTERMINE HCL 37.5 MG PO TABS: 37.5000 mg | ORAL_TABLET | Freq: Every day | ORAL | 2 refills | Status: AC

## 2023-03-17 MED ORDER — METFORMIN HCL 500 MG PO TABS: ORAL_TABLET | ORAL | 5 refills | Status: AC

## 2023-03-17 MED ORDER — PHENTERMINE HCL 30 MG PO CAPS: 30.0000 mg | ORAL_CAPSULE | ORAL | 0 refills | Status: AC

## 2023-03-17 NOTE — Patient Instructions (Addendum)
Preventive Care 41-27 Years Old, Female Preventive care refers to lifestyle choices and visits with your health care provider that can promote health and wellness. Preventive care visits are also called wellness exams. What can I expect for my preventive care visit? Counseling During your preventive care visit, your health care provider may ask about your: Medical history, including: Past medical problems. Family medical history. Pregnancy history. Current health, including: Menstrual cycle. Method of birth control. Emotional well-being. Home life and relationship well-being. Sexual activity and sexual health. Lifestyle, including: Alcohol, nicotine or tobacco, and drug use. Access to firearms. Diet, exercise, and sleep habits. Work and work Astronomer. Sunscreen use. Safety issues such as seatbelt and bike helmet use. Physical exam Your health care provider may check your: Height and weight. These may be used to calculate your BMI (body mass index). BMI is a measurement that tells if you are at a healthy weight. Waist circumference. This measures the distance around your waistline. This measurement also tells if you are at a healthy weight and may help predict your risk of certain diseases, such as type 2 diabetes and high blood pressure. Heart rate and blood pressure. Body temperature. Skin for abnormal spots. What immunizations do I need?  Vaccines are usually given at various ages, according to a schedule. Your health care provider will recommend vaccines for you based on your age, medical history, and lifestyle or other factors, such as travel or where you work. What tests do I need? Screening Your health care provider may recommend screening tests for certain conditions. This may include: Pelvic exam and Pap test. Lipid and cholesterol levels. Diabetes screening. This is done by checking your blood sugar (glucose) after you have not eaten for a while (fasting). Hepatitis  B test. Hepatitis C test. HIV (human immunodeficiency virus) test. STI (sexually transmitted infection) testing, if you are at risk. BRCA-related cancer screening. This may be done if you have a family history of breast, ovarian, tubal, or peritoneal cancers. Talk with your health care provider about your test results, treatment options, and if necessary, the need for more tests. Follow these instructions at home: Eating and drinking  Eat a healthy diet that includes fresh fruits and vegetables, whole grains, lean protein, and low-fat dairy products. Take vitamin and mineral supplements as recommended by your health care provider. Do not drink alcohol if: Your health care provider tells you not to drink. You are pregnant, may be pregnant, or are planning to become pregnant. If you drink alcohol: Limit how much you have to 0-1 drink a day. Know how much alcohol is in your drink. In the [place] one drink equals one 12 oz bottle of beer (355 mL), one 5 oz glass of wine (148 mL), or one 1 oz glass of hard liquor (44 mL). Lifestyle Brush your teeth every morning and night with fluoride toothpaste. Floss one time each day. Exercise for at least 30 minutes 5 or more days each week. Do not use any products that contain nicotine or tobacco. These products include cigarettes, chewing tobacco, and vaping devices, such as e-cigarettes. If you need help quitting, ask your health care provider. Do not use drugs. If you are sexually active, practice safe sex. Use a condom or other form of protection to prevent STIs. If you do not wish to become pregnant, use a form of birth control. If you plan to become pregnant, see your health care provider for a prepregnancy visit. Find healthy ways to manage stress, such as: Meditation,  yoga, or listening to music. Journaling. Talking to a trusted person. Spending time with friends and family. Minimize exposure to UV radiation to reduce your risk of skin  cancer. Safety Always wear your seat belt while driving or riding in a vehicle. Do not drive: If you have been drinking alcohol. Do not ride with someone who has been drinking. If you have been using any mind-altering substances or drugs. While texting. When you are tired or distracted. Wear a helmet and other protective equipment during sports activities. If you have firearms in your house, make sure you follow all gun safety procedures. Seek help if you have been physically or sexually abused. What's next? Go to your health care provider once a year for an annual wellness visit. Ask your health care provider how often you should have your eyes and teeth checked. Stay up to date on all vaccines. This information is not intended to replace advice given to you by your health care provider. Make sure you discuss any questions you have with your health care provider. Document Revised: 12/10/2020 Document Reviewed: 12/10/2020 Elsevier Patient Education  2024 [employer] Inc. Breast Self-Awareness Breast self-awareness is knowing how your breasts look and feel. You need to: Check your breasts on a regular basis. Tell your doctor about any changes. Become familiar with the look and feel of your breasts. This can help you catch a breast problem while it is still small and can be treated. You should do breast self-exams even if you have breast implants. What you need: A mirror. A well-lit room. A pillow or other soft object. How to do a breast self-exam Follow these steps to do a breast self-exam: Look for changes  Take off all the clothes above your waist. Stand in front of a mirror in a room with good lighting. Put your hands down at your sides. Compare your breasts in the mirror. Look for any difference between them, such as: A difference in shape. A difference in size. Wrinkles, dips, and bumps in one breast and not the other. Look at each breast for changes in the skin, such  as: Redness. Scaly areas. Skin that has gotten thicker. Dimpling. Open sores (ulcers). Look for changes in your nipples, such as: Fluid coming out of a nipple. Fluid around a nipple. Bleeding. Dimpling. Redness. A nipple that looks pushed in (retracted), or that has changed position. Feel for changes Lie on your back. Feel each breast. To do this: Pick a breast to feel. Place a pillow under the shoulder closest to that breast. Put the arm closest to that breast behind your head. Feel the nipple area of that breast using the hand of your other arm. Feel the area with the pads of your three middle fingers by making small circles with your fingers. Use light, medium, and firm pressure. Continue the overlapping circles, moving downward over the breast. Keep making circles with your fingers. Stop when you feel your ribs. Start making circles with your fingers again, this time going upward until you reach your collarbone. Then, make circles outward across your breast and into your armpit area. Squeeze your nipple. Check for discharge and lumps. Repeat these steps to check your other breast. Sit or stand in the tub or shower. With soapy water on your skin, feel each breast the same way you did when you were lying down. Write down what you find Writing down what you find can help you remember what to tell your doctor. Write down: What is  normal for each breast. Any changes you find in each breast. These include: The kind of changes you find. A tender or painful breast. Any lump you find. Write down its size and where it is. When you last had your monthly period (menstrual cycle). General tips If you are breastfeeding, the best time to check your breasts is after you feed your baby or after you use a breast pump. If you get monthly bleeding, the best time to check your breasts is 5-7 days after your monthly cycle ends. With time, you will become comfortable with the self-exam. You will  also start to know if there are changes in your breasts. Contact a doctor if: You see a change in the shape or size of your breasts or nipples. You see a change in the skin of your breast or nipples, such as red or scaly skin. You have fluid coming from your nipples that is not normal. You find a new lump or thick area. You have breast pain. You have any concerns about your breast health. Summary Breast self-awareness includes looking for changes in your breasts and feeling for changes within your breasts. You should do breast self-awareness in front of a mirror in a well-lit room. If you get monthly periods (menstrual cycles), the best time to check your breasts is 5-7 days after your period ends. Tell your doctor about any changes you see in your breasts. Changes include changes in size, changes on the skin, painful or tender breasts, or fluid from your nipples that is not normal. This information is not intended to replace advice given to you by your health care provider. Make sure you discuss any questions you have with your health care provider. Document Revised: 11/19/2021 Document Reviewed: 04/16/2021 Elsevier Patient Education  2024 [employer].

## 2023-03-17 NOTE — Progress Notes (Signed)
GYNECOLOGY ANNUAL PHYSICAL EXAM PROGRESS NOTE  Subjective:    Jordan David is a 27 y.o. No obstetric history on file. female who presents for an annual exam.  He was previously a patient of Dr. Donavan Burnet at [facility], last seen in 2022.  The patient is sexually active (same partner of 5 years). The patient participates in regular exercise: yes. Has the patient ever been transfused or tattooed?: no. The patient reports that there is not domestic violence in her life.   The patient has the following complaints today: Notes that she would like to discuss her fertility.  She notes she was trying last year to conceive but stopped due to other life issues.  Has a history of PCOS as well, with irregular periods, issues with her weight. Had a normal pelvic ultrasound in 2022. Current partner has 1 child from prior relationship.  Menstrual History: Menarche age: 55 Patient's last menstrual period was 02/14/2023 (exact date). Period Duration (Days): 14 Period Pattern: (!) Irregular Menstrual Flow: Moderate, Light, Heavy Dysmenorrhea: (!) Mild Dysmenorrhea Symptoms: Cramping   Gynecologic History:  Contraception: none History of STI's: Denies Last Pap: 04/15/2021. Results were: normal. Denies/Notes h/o abnormal pap smears. Last mammogram: n/a.    Upstream - 03/17/23 1610       Pregnancy Intention Screening   Does the patient want to become pregnant in the next year? Yes    Does the patient's partner want to become pregnant in the next year? Yes    Would the patient like to discuss contraceptive options today? No      Contraception Wrap Up   Current Method No Contraceptive Precautions    End Method No Contraception Precautions    Contraception Counseling Provided No    How was the end contraceptive method provided? N/A            The pregnancy intention screening data noted above was reviewed. Potential methods of contraception were discussed. The patient  elected to proceed with No Contraception Precautions.     Past Medical History:  Diagnosis Date   Allergy    Anxiety    controlled with medication   Asthma    Back pain    scoliosis and dislocated disc (>1 year of pain)   Depression    controlled with medication   Lumbar disc disorder    Stomach ulcer 2015     Past Surgical History:  Procedure Laterality Date   ESOPHAGOGASTRODUODENOSCOPY (EGD) WITH PROPOFOL N/A 05/10/2017   Procedure: ESOPHAGOGASTRODUODENOSCOPY (EGD) WITH PROPOFOL;  Surgeon: Toledo, Boykin Nearing, MD;  Location: [facility] ENDOSCOPY;  Service: Gastroenterology;  Laterality: N/A;   FLEXIBLE SIGMOIDOSCOPY N/A 05/10/2017   Procedure: FLEXIBLE SIGMOIDOSCOPY;  Surgeon: Toledo, Boykin Nearing, MD;  Location: [facility] ENDOSCOPY;  Service: Gastroenterology;  Laterality: N/A;    History reviewed. No pertinent family history.  Social History   Socioeconomic History   Marital status: Married    Spouse name: Not on file   Number of children: Not on file   Years of education: Not on file   Highest education level: Not on file  Occupational History   Not on file  Tobacco Use   Smoking status: Never   Smokeless tobacco: Never  Vaping Use   Vaping status: Some Days  Substance and Sexual Activity   Alcohol use: No   Drug use: Never   Sexual activity: Yes    Partners: Male    Birth control/protection: None  Other Topics Concern   Not on  file  Social History Narrative   Not on file   Social Determinants of Health   Financial Resource Strain: Not on file  Food Insecurity: Not on file  Transportation Needs: Not on file  Physical Activity: Not on file  Stress: Not on file  Social Connections: Not on file  Intimate Partner Violence: Not on file    Current Outpatient Medications on File Prior to Visit  Medication Sig Dispense Refill   albuterol (PROVENTIL HFA;VENTOLIN HFA) 108 (90 Base) MCG/ACT inhaler Inhale as needed into the lungs for wheezing or shortness of breath.      brompheniramine-pseudoephedrine-DM 30-2-10 MG/5ML syrup Take 5 mLs by mouth 4 (four) times daily as needed. (Patient not taking: Reported on 04/15/2021) 120 mL 0   buPROPion (WELLBUTRIN XL) 300 MG 24 hr tablet Take 300 mg by mouth every morning. (Patient not taking: Reported on 04/15/2021)  1   cholecalciferol (VITAMIN D) 400 units TABS tablet Take 800 Units by mouth. (Patient not taking: Reported on 04/15/2021)     clindamycin (CLINDAGEL) 1 % gel Apply topically 2 (two) times daily. 30 g 11   fluticasone (FLONASE) 50 MCG/ACT nasal spray Place 1 spray into both nostrils daily. 16 g 0   metroNIDAZOLE (FLAGYL) 500 MG tablet Take 1 tablet (500 mg total) by mouth 2 (two) times daily. 14 tablet 0   mirtazapine (REMERON) 30 MG tablet Take 30 mg by mouth at bedtime. (Patient not taking: Reported on 04/15/2021)  1   norethindrone (MICRONOR) 0.35 MG tablet Take 1 tablet (0.35 mg total) by mouth daily. 28 tablet 11   phentermine (ADIPEX-P) 37.5 MG tablet Take 37.5 mg daily before breakfast by mouth.     sertraline (ZOLOFT) 100 MG tablet Take 100 mg daily by mouth. (Patient not taking: Reported on 04/15/2021)     Current Facility-Administered Medications on File Prior to Visit  Medication Dose Route Frequency Provider Last Rate Last Admin   betamethasone acetate-betamethasone sodium phosphate (CELESTONE) injection 3 mg  3 mg Intramuscular Once Felecia Shelling, DPM        No Known Allergies   Review of Systems Constitutional: negative for chills, fatigue, fevers and sweats. Positive for weight fluctuations. Eyes: negative for irritation, redness and visual disturbance Ears, nose, mouth, throat, and face: negative for hearing loss, nasal congestion, snoring and tinnitus Respiratory: negative for asthma, cough, sputum Cardiovascular: negative for chest pain, dyspnea, exertional chest pressure/discomfort, irregular heart beat, palpitations and syncope Gastrointestinal: negative for abdominal pain, change in  bowel habits, nausea and vomiting Genitourinary: positive for abnormal menstrual periods.  Negative for genital lesions, sexual problems and vaginal discharge, dysuria and urinary incontinence Integument/breast: negative for breast lump, breast tenderness and nipple discharge Hematologic/lymphatic: negative for bleeding and easy bruising Musculoskeletal:negative for back pain and muscle weakness Neurological: negative for dizziness, headaches, vertigo and weakness Endocrine: negative for diabetic symptoms including polydipsia, polyuria and skin dryness Allergic/Immunologic: negative for hay fever and urticaria      Objective:  Blood pressure (!) 140/80, pulse 83, height 5\' 3"  (1.6 m), weight (!) 309 lb (140.2 kg), last menstrual period 02/14/2023. Body mass index is 54.74 kg/m.    General Appearance:    Alert, cooperative, no distress, appears stated age, morbidly obese, moderate facial hair noted in chin region  Head:    Normocephalic, without obvious abnormality, atraumatic  Eyes:    PERRL, conjunctiva/corneas clear, EOM's intact, both eyes  Ears:    Normal external ear canals, both ears  Nose:   Nares normal,  septum midline, mucosa normal, no drainage or sinus tenderness  Throat:   Lips, mucosa, and tongue normal; teeth and gums normal  Neck:   Supple, symmetrical, trachea midline, no adenopathy; thyroid: no enlargement/tenderness/nodules; no carotid bruit or JVD  Back:     Symmetric, no curvature, ROM normal, no CVA tenderness  Lungs:     Clear to auscultation bilaterally, respirations unlabored  Chest Wall:    No tenderness or deformity   Heart:    Regular rate and rhythm, S1 and S2 normal, no murmur, rub or gallop  Breast Exam:    No tenderness, masses, or nipple abnormality  Abdomen:     Soft, non-tender, bowel sounds active all four quadrants, no masses, no organomegaly.    Genitalia:    Pelvic:external genitalia normal, vagina without lesions, discharge, or tenderness,  rectovaginal septum  normal. Cervix normal in appearance, no cervical motion tenderness, no adnexal masses or tenderness.  Uterus normal size, shape, mobile, regular contours, nontender.  Rectal:    Normal external sphincter.  No hemorrhoids appreciated. Internal exam not done.   Extremities:   Extremities normal, atraumatic, no cyanosis or edema  Pulses:   2+ and symmetric all extremities  Skin:   Skin color, texture, turgor normal, multiple hyperpigmented flat skin lesions scattered over breast and abdomen (has h/o HS)  Lymph nodes:   Cervical, supraclavicular, and axillary nodes normal  Neurologic:   CNII-XII intact, normal strength, sensation and reflexes throughout   .  Labs:  Lab Results  Component Value Date   WBC 10.4 08/16/2022   HGB 13.7 08/16/2022   HCT 42.5 08/16/2022   MCV 86.0 08/16/2022   PLT 376 08/16/2022    Lab Results  Component Value Date   CREATININE 0.76 08/16/2022   BUN 15 08/16/2022   NA 138 08/16/2022   K 4.0 08/16/2022   CL 102 08/16/2022   CO2 27 08/16/2022    Lab Results  Component Value Date   ALT 25 03/22/2016   AST 19 03/22/2016   ALKPHOS 52 03/22/2016   BILITOT 0.6 03/22/2016    Lab Results  Component Value Date   TSH 1.600 04/15/2021     Assessment:   1. Well woman exam with routine gynecological exam   2. PCOS (polycystic ovarian syndrome)   3. Morbid obesity with BMI of 50.0-59.9, adult (HCC)   4. Irregular periods/menstrual cycles   5. Primary anovulatory infertility   6. Prediabetes      Plan:   -Discussed infertility, likely secondary to anovulatory cycles in the past.  Advised on management of her PCOS and also COVID vaccination status: Follow up in 1 year for annual exam   1. Well woman exam with routine gynecological exam - Blood tests: See orders. PCOS and routine annual labs ordered. - Breast self exam technique reviewed and patient encouraged to perform self-exam monthly. - Pap smear: performed today.  -  Contraception: None. Desires fertility.     2. PCOS (polycystic ovarian syndrome) -Patient with a history of PCOS, most likely cause of her history of infertility.  Will order labs to assess her current status. -Discussed initiation of metformin that can help to manage her prediabetes most likely associated with PCOS.  Also can be helpful in weight loss and return to fertility.  Patient okay to initiate.  Prescription sent.  3. Morbid obesity with BMI of 50.0-59.9, adult (HCC) - Discussed healthy lifestyle modifications.  Patient states that she has attempted weight loss before and has been successful, losing  almost 30 pounds.  Notes that she plans on dietary modification and will attempt to increase physical activity as tolerated.  I also offered patient the option to see nutritionist. - The risks and benefits and side effects of medication, such as Adipex (Phenteramine),  Belviq (lorcarsin), Contrave (buproprion/naltrexone), Qsymia (phentermine/topiramate), and Saxenda (liraglutide) were discussed. The pros and cons of suppressing appetite and boosting metabolism is discussed. Risks of tolerence and addiction is discussed for selected agents discussed. Use of medicine will ne short term, such as 3-4 months at a time followed by a period of time off of the medicine to avoid these risks and side effects for Adipex discussed. Pt to call with any negative side effects and agrees to keep follow up appts. initiated on 30 mg dosing and will titrate dosing up.  Patient does note a history of GI upset with phentermine at higher doses so we will titrate up slowly.  4. Irregular periods/menstrual cycles - Her cycles have become more regular over the past several months in light of previous history of oligomenorrhea.  Discussed option of having prescription for Provera on hand in case of amenorrhea, or if patient is desiring to lose weight with medication use could consider starting OCPs for 3 to 4 months that  will also help to regulate her cycles and prevent pregnancy while utilizing other medications.  Patient desires Provera, notes she will track her cycles with an app and avoid fertile week.  5. Primary anovulatory infertility -Advised on patient controlling her PCOS and weight loss which should help to improve her fertility as she will likely return to having normal regular cycles consistently.  Also discussed that weight loss can to improve her overall health.  6. Prediabetes -Hemoglobin A1c ordered to assess levels.  Also prescribed metformin.  Patient declined use of GLP 1 if her levels show that she is actually diabetic, as she notes she does not want to have to do injections.   Follow up in 4-6 weeks  for weight loss.  Follow-up in 1 year for annual exam.   A total of 48 minutes were spent during this encounter, including review of previous progress notes, recent imaging and labs, face-to-face with time with patient involving counseling and coordination of care, as well as documentation for current visit.  Hildred Laser MD  OB/GYN

## 2023-03-21 ENCOUNTER — Encounter: Payer: Self-pay | Admitting: Obstetrics and Gynecology

## 2023-03-22 ENCOUNTER — Ambulatory Visit: Payer: BC Managed Care – PPO | Admitting: Obstetrics and Gynecology

## 2023-03-22 LAB — CBC
Hematocrit: 43.6 % (ref 34.0–46.6)
Hemoglobin: 13.9 g/dL (ref 11.1–15.9)
MCH: 27.9 pg (ref 26.6–33.0)
MCHC: 31.9 g/dL (ref 31.5–35.7)
MCV: 88 fL (ref 79–97)
Platelets: 347 10*3/uL (ref 150–450)
RBC: 4.98 x10E6/uL (ref 3.77–5.28)
RDW: 12.6 % (ref 11.7–15.4)
WBC: 6.9 10*3/uL (ref 3.4–10.8)

## 2023-03-22 LAB — TESTOSTERONE, FREE, TOTAL, SHBG
Sex Hormone Binding: 20.6 nmol/L — ABNORMAL LOW (ref 24.6–122.0)
Testosterone, Free: 1.3 pg/mL (ref 0.0–4.2)
Testosterone: 43 ng/dL (ref 13–71)

## 2023-03-22 LAB — COMPREHENSIVE METABOLIC PANEL
ALT: 33 IU/L — ABNORMAL HIGH (ref 0–32)
AST: 19 IU/L (ref 0–40)
Albumin: 4.3 g/dL (ref 4.0–5.0)
Alkaline Phosphatase: 81 IU/L (ref 44–121)
BUN/Creatinine Ratio: 15 (ref 9–23)
BUN: 10 mg/dL (ref 6–20)
Bilirubin Total: <0.2 mg/dL (ref 0.0–1.2)
CO2: 22 mmol/L (ref 20–29)
Calcium: 9.4 mg/dL (ref 8.7–10.2)
Chloride: 105 mmol/L (ref 96–106)
Creatinine, Ser: 0.68 mg/dL (ref 0.57–1.00)
Globulin, Total: 2.5 g/dL (ref 1.5–4.5)
Glucose: 101 mg/dL — ABNORMAL HIGH (ref 70–99)
Potassium: 4.6 mmol/L (ref 3.5–5.2)
Sodium: 139 mmol/L (ref 134–144)
Total Protein: 6.8 g/dL (ref 6.0–8.5)
eGFR: 123 mL/min/{1.73_m2} (ref >59)

## 2023-03-22 LAB — LIPID PANEL
Chol/HDL Ratio: 4.1 ratio (ref 0.0–4.4)
Cholesterol, Total: 160 mg/dL (ref 100–199)
HDL: 39 mg/dL — ABNORMAL LOW (ref >39)
LDL Chol Calc (NIH): 106 mg/dL — ABNORMAL HIGH (ref 0–99)
Triglycerides: 80 mg/dL (ref 0–149)
VLDL Cholesterol Cal: 15 mg/dL (ref 5–40)

## 2023-03-22 LAB — PROGESTERONE: Progesterone: 0.3 ng/mL

## 2023-03-22 LAB — HEMOGLOBIN A1C
Est. average glucose Bld gHb Est-mCnc: 128 mg/dL
Hgb A1c MFr Bld: 6.1 % — ABNORMAL HIGH (ref 4.8–5.6)

## 2023-03-22 LAB — DHEA-SULFATE: DHEA-SO4: 341 ug/dL (ref 84.8–378.0)

## 2023-03-22 LAB — FSH/LH
FSH: 6 m[IU]/mL
LH: 8.6 m[IU]/mL

## 2023-03-22 LAB — INSULIN, RANDOM: INSULIN: 49.3 u[IU]/mL — ABNORMAL HIGH (ref 2.6–24.9)

## 2023-03-22 LAB — ESTRADIOL: Estradiol: 48.7 pg/mL

## 2023-03-22 LAB — TSH: TSH: 2.13 u[IU]/mL (ref 0.450–4.500)

## 2023-03-23 ENCOUNTER — Encounter: Payer: Self-pay | Admitting: Obstetrics and Gynecology

## 2023-03-23 LAB — CYTOLOGY - PAP
Chlamydia: NEGATIVE
Comment: NEGATIVE
Comment: NORMAL
Diagnosis: NEGATIVE
Diagnosis: REACTIVE
Neisseria Gonorrhea: NEGATIVE

## 2023-04-25 NOTE — Progress Notes (Deleted)
    GYNECOLOGY PROGRESS NOTE  Subjective:    Patient ID: Jordan David, female    DOB: 17-Apr-1996, 27 y.o.   MRN: [id number]  HPI  Patient is a 27 y.o. female who presents for 1 month weight management follow up. She has a past history of obesity. She initiated use of Phentermine 30 mg for 14 day, then increase to 37.5 mg x 1 month ago. Denies any undesirable side effects and reports compliance with medications.    Current interventions:  1. Diet -  2. Activity -  3. Reports bowel movements are ***.    {Common ambulatory SmartLinks:19316}  Review of Systems {ros; complete:30496}   Objective:       03/17/2023    8:02 AM 02/14/2023   11:09 PM 08/17/2022   12:22 AM  Vitals with BMI  Height 5\' 3"  5\' 3"    Weight 309 lbs 285 lbs   BMI 54.75 50.5   Systolic 140 138 409  Diastolic 80 91 71  Pulse 83 84 83    General appearance: {general exam:16600} Abdomen: soft, non-tender.  Waist circumference *** in.    Labs:   Assessment:   Weight management Obesity, There is no height or weight on file to calculate BMI.  Plan:   Weight management  - doing well with weight loss, can continue current management.      Hildred Laser, MD [facility]

## 2023-04-27 ENCOUNTER — Ambulatory Visit: Payer: BC Managed Care – PPO | Admitting: Obstetrics and Gynecology

## 2023-06-01 ENCOUNTER — Ambulatory Visit: Payer: BC Managed Care – PPO | Admitting: Obstetrics and Gynecology

## 2023-06-01 NOTE — Progress Notes (Unsigned)
    GYNECOLOGY PROGRESS NOTE  Subjective:    Patient ID: Jordan David, female    DOB: 31-Dec-1995, 27 y.o.   MRN: [id number]  HPI  Patient is a 27 y.o. female who presents for 2 month weight management follow up. She has a past history of obesity. She initiated use of Phentermine 2 months ago.  Denies any undesirable side effects and reports compliance with medications.    Current interventions:  1. Diet -  2. Activity -  3. Reports bowel movements are ***.    {Common ambulatory SmartLinks:19316}  Review of Systems {ros; complete:30496}   Objective:       03/17/2023    8:02 AM 02/14/2023   11:09 PM 08/17/2022   12:22 AM  Vitals with BMI  Height 5\' 3"  5\' 3"    Weight 309 lbs 285 lbs   BMI 54.75 50.5   Systolic 140 138 952  Diastolic 80 91 71  Pulse 83 84 83    General appearance: {general exam:16600} Abdomen: soft, non-tender.  Waist circumference *** in.    Labs:   Assessment:   Weight management Obesity, There is no height or weight on file to calculate BMI.  Plan:   Weight management  - doing well with weight loss, can continue current management.      Hildred Laser, MD [place] [facility]

## 2023-06-01 NOTE — Patient Instructions (Incomplete)
Obesity, Adult Obesity is having too much body fat. Being obese means that your weight is more than what is healthy for you.  BMI (body mass index) is a number that explains how much body fat you have. If you have a BMI of 30 or more, you are obese. Obesity can cause serious health problems, such as: Stroke. Coronary artery disease (CAD). Type 2 diabetes. Some types of cancer. High blood pressure (hypertension). High cholesterol. Gallbladder stones. Obesity can also contribute to: Osteoarthritis. Sleep apnea. Infertility problems. What are the causes? Eating meals each day that are high in calories, sugar, and fat. Drinking a lot of drinks that have sugar in them. Being born with genes that may make you more likely to become obese. Having a medical condition that causes obesity. Taking certain medicines. Sitting a lot (having a sedentary lifestyle). Not getting enough sleep. What increases the risk? Having a family history of obesity. Living in an area with limited access to: [place], recreation centers, or sidewalks. Healthy food choices, such as grocery stores and farmers' markets. What are the signs or symptoms? The main sign is having too much body fat. How is this treated? Treatment for this condition often includes changing your lifestyle. Treatment may include: Changing your diet. This may include making a healthy meal plan. Exercise. This may include activity that causes your heart to beat faster (aerobic exercise) and strength training. Work with your doctor to design a program that works for you. Medicine to help you lose weight. This may be used if you are not able to lose one pound a week after 6 weeks of healthy eating and more exercise. Treating conditions that cause the obesity. Surgery. Options may include gastric banding and gastric bypass. This may be done if: Other treatments have not helped to improve your condition. You have a BMI of 40 or higher. You have  life-threatening health problems related to obesity. Follow these instructions at home: Eating and drinking  Follow advice from your doctor about what to eat and drink. Your doctor may tell you to: Limit fast food, sweets, and processed snack foods. Choose low-fat options. For example, choose low-fat milk instead of whole milk. Eat five or more servings of fruits or vegetables each day. Eat at home more often. This gives you more control over what you eat. Choose healthy foods when you eat out. Learn to read food labels. This will help you learn how much food is in one serving. Keep low-fat snacks available. Avoid drinks that have a lot of sugar in them. These include soda, fruit juice, iced tea with sugar, and flavored milk. Drink enough water to keep your pee (urine) pale yellow. Do not go on fad diets. Physical activity Exercise often, as told by your doctor. Most adults should get up to 150 minutes of moderate-intensity exercise every week.Ask your doctor: What types of exercise are safe for you. How often you should exercise. Warm up and stretch before being active. Do slow stretching after being active (cool down). Rest between times of being active. Lifestyle Work with your doctor and a food expert (dietitian) to set a weight-loss goal that is best for you. Limit your screen time. Find ways to reward yourself that do not involve food. Do not drink alcohol if: Your doctor tells you not to drink. You are pregnant, may be pregnant, or are planning to become pregnant. If you drink alcohol: Limit how much you have to: 0-1 drink a day for women. 0-2 drinks  a day for men. Know how much alcohol is in your drink. In the [place] one drink equals one 12 oz bottle of beer (355 mL), one 5 oz glass of wine (148 mL), or one 1 oz glass of hard liquor (44 mL). General instructions Keep a weight-loss journal. This can help you keep track of: The food that you eat. How much exercise you  get. Take over-the-counter and prescription medicines only as told by your doctor. Take vitamins and supplements only as told by your doctor. Think about joining a support group. Pay attention to your mental health as obesity can lead to depression or self esteem issues. Keep all follow-up visits. Contact a doctor if: You cannot meet your weight-loss goal after you have changed your diet and lifestyle for 6 weeks. You are having trouble breathing. Summary Obesity is having too much body fat. Being obese means that your weight is more than what is healthy for you. Work with your doctor to set a weight-loss goal. Get regular exercise as told by your doctor. This information is not intended to replace advice given to you by your health care provider. Make sure you discuss any questions you have with your health care provider. Document Revised: 01/20/2021 Document Reviewed: 01/20/2021 Elsevier Patient Education  2024 [employer] Exercising to Lose Weight Getting regular exercise is important for everyone. It is especially important if you are overweight. Being overweight increases your risk of heart disease, stroke, diabetes, high blood pressure, and several types of cancer. Exercising, and reducing the calories you consume, can help you lose weight and improve fitness and health. Exercise can be moderate or vigorous intensity. To lose weight, most people need to do a certain amount of moderate or vigorous-intensity exercise each week. How can exercise affect me? You lose weight when you exercise enough to burn more calories than you eat. Exercise also reduces body fat and builds muscle. The more muscle you have, the more calories you burn. Exercise also: Improves mood. Reduces stress and tension. Improves your overall fitness, flexibility, and endurance. Increases bone strength. Moderate-intensity exercise  Moderate-intensity exercise is any activity that gets you moving enough to burn at  least three times more energy (calories) than if you were sitting. Examples of moderate exercise include: Walking a mile in 15 minutes. Doing light yard work. Biking at an easy pace. Most people should get at least 150 minutes of moderate-intensity exercise a week to maintain their body weight. Vigorous-intensity exercise Vigorous-intensity exercise is any activity that gets you moving enough to burn at least six times more calories than if you were sitting. When you exercise at this intensity, you should be working hard enough that you are not able to carry on a conversation. Examples of vigorous exercise include: Running. Playing a team sport, such as football, basketball, and soccer. Jumping rope. Most people should get at least 75 minutes a week of vigorous exercise to maintain their body weight. What actions can I take to lose weight? The amount of exercise you need to lose weight depends on: Your age. The type of exercise. Any health conditions you have. Your overall physical ability. Talk to your health care provider about how much exercise you need and what types of activities are safe for you. Nutrition  Make changes to your diet as told by your health care provider or diet and nutrition specialist (dietitian). This may include: Eating fewer calories. Eating more protein. Eating less unhealthy fats. Eating a diet that includes fresh fruits  and vegetables, whole grains, low-fat dairy products, and lean protein. Avoiding foods with added fat, salt, and sugar. Drink plenty of water while you exercise to prevent dehydration or heat stroke. Activity Choose an activity that you enjoy and set realistic goals. Your health care provider can help you make an exercise plan that works for you. Exercise at a moderate or vigorous intensity most days of the week. The intensity of exercise may vary from person to person. You can tell how intense a workout is for you by paying attention to  your breathing and heartbeat. Most people will notice their breathing and heartbeat get faster with more intense exercise. Do resistance training twice each week, such as: Push-ups. Sit-ups. Lifting weights. Using resistance bands. Getting short amounts of exercise can be just as helpful as long, structured periods of exercise. If you have trouble finding time to exercise, try doing these things as part of your daily routine: Get up, stretch, and walk around every 30 minutes throughout the day. Go for a walk during your lunch break. Park your car farther away from your destination. If you take public transportation, get off one stop early and walk the rest of the way. Make phone calls while standing up and walking around. Take the stairs instead of elevators or escalators. Wear comfortable clothes and shoes with good support. Do not exercise so much that you hurt yourself, feel dizzy, or get very short of breath. Where to find more information [place] Department of Health and Human Services: ThisPath.fi Centers for Disease Control and Prevention: [employer]Ex[employer].com.br Contact a health care provider: Before starting a new exercise program. If you have questions or concerns about your weight. If you have a medical problem that keeps you from exercising. Get help right away if: You have any of the following while exercising: Injury. Dizziness. Difficulty breathing or shortness of breath that does not go away when you stop exercising. Chest pain. Rapid heartbeat. These symptoms may represent a serious problem that is an emergency. Do not wait to see if the symptoms will go away. Get medical help right away. Call your local emergency services (911 in the [place] Do not drive yourself to the hospital. Summary Getting regular exercise is especially important if you are overweight. Being overweight increases your risk of heart disease, stroke, diabetes, high blood pressure, and several types of  cancer. Losing weight happens when you burn more calories than you eat. Reducing the amount of calories you eat, and getting regular moderate or vigorous exercise each week, helps you lose weight. This information is not intended to replace advice given to you by your health care provider. Make sure you discuss any questions you have with your health care provider. Document Revised: 08/10/2020 Document Reviewed: 08/10/2020 Elsevier Patient Education  2024 [employer].

## 2023-07-20 ENCOUNTER — Other Ambulatory Visit: Payer: Self-pay

## 2023-07-20 ENCOUNTER — Encounter: Payer: Self-pay | Admitting: *Deleted

## 2023-07-20 ENCOUNTER — Emergency Department
Admission: EM | Admit: 2023-07-20 | Discharge: 2023-07-20 | Disposition: A | Discharge status: Home / Self Care | Payer: BC Managed Care – PPO | Attending: Emergency Medicine | Admitting: Emergency Medicine

## 2023-07-20 ENCOUNTER — Emergency Department: Payer: BC Managed Care – PPO

## 2023-07-20 DIAGNOSIS — J069 Acute upper respiratory infection, unspecified: Secondary | ICD-10-CM | POA: Insufficient documentation

## 2023-07-20 DIAGNOSIS — Z20822 Contact with and (suspected) exposure to covid-19: Secondary | ICD-10-CM | POA: Insufficient documentation

## 2023-07-20 DIAGNOSIS — B9789 Other viral agents as the cause of diseases classified elsewhere: Secondary | ICD-10-CM | POA: Insufficient documentation

## 2023-07-20 DIAGNOSIS — J45909 Unspecified asthma, uncomplicated: Secondary | ICD-10-CM | POA: Insufficient documentation

## 2023-07-20 DIAGNOSIS — R059 Cough, unspecified: Secondary | ICD-10-CM | POA: Diagnosis present

## 2023-07-20 LAB — RESP PANEL BY RT-PCR (RSV, FLU A&B, COVID)  RVPGX2
Influenza A by PCR: NEGATIVE
Influenza B by PCR: NEGATIVE
Resp Syncytial Virus by PCR: NEGATIVE
SARS Coronavirus 2 by RT PCR: NEGATIVE

## 2023-07-20 LAB — GROUP A STREP BY PCR: Group A Strep by PCR: NOT DETECTED

## 2023-07-20 MED ORDER — ALBUTEROL SULFATE HFA 108 (90 BASE) MCG/ACT IN AERS
2.0000 | INHALATION_SPRAY | Freq: Four times a day (QID) | RESPIRATORY_TRACT | 2 refills | Status: AC | PRN
Start: 2023-07-20 — End: ?

## 2023-07-20 MED ORDER — BENZONATATE 100 MG PO CAPS: 100.0000 mg | ORAL_CAPSULE | Freq: Three times a day (TID) | ORAL | 0 refills | Status: AC | PRN

## 2023-07-20 MED ORDER — ACETAMINOPHEN 500 MG PO TABS
1000.0000 mg | ORAL_TABLET | Freq: Once | ORAL | Status: AC
  Administered 2023-07-20: 1000 mg via ORAL
  Filled 2023-07-20: qty 2

## 2023-07-20 NOTE — ED Provider Notes (Signed)
[facility] Emergency Department Provider Note     Event Date/Time   First MD Initiated Contact with Patient 07/20/23 2139     (approximate)   History   Cough and Sore Throat   HPI  Jordan David is a 28 y.o. female with a history of asthma presents to the ED for evaluation of cough and sore throat.  Patient reports she woke up Friday with a sore throat that progressed into a dry cough.  She has tried Tamiflu and cough syrup with no relief.  Denies fever and sick contacts.      Physical Exam   Triage Vital Signs: ED Triage Vitals  Encounter Vitals Group     BP 07/20/23 1937 (!) 156/93     Systolic BP Percentile --      Diastolic BP Percentile --      Pulse Rate 07/20/23 1937 93     Resp 07/20/23 1937 18     Temp 07/20/23 1937 99.4 F (37.4 C)     Temp Source 07/20/23 1937 Oral     SpO2 07/20/23 1937 98 %     Weight 07/20/23 1938 300 lb (136.1 kg)     Height 07/20/23 1938 5\' 3"  (1.6 m)     Head Circumference --      Peak Flow --      Pain Score 07/20/23 1937 5     Pain Loc --      Pain Education --      Exclude from Growth Chart --     Most recent vital signs: Vitals:   07/20/23 1937 07/20/23 2248  BP: (!) 156/93 120/71  Pulse: 93 90  Resp: 18 18  Temp: 99.4 F (37.4 C) 98.4 F (36.9 C)  SpO2: 98% 99%    General: Well appearing. Alert and oriented. INAD.  Head:  NCAT.  Eyes:  PERRLA. EOMI.  Nose:   Mucosa is moist. No rhinorrhea. Throat: Oropharynx clear. No erythema or exudates. Tonsils not enlarged. Uvula is midline. Neck:   No cervical spine tenderness to palpation. Full ROM without difficulty.  CV:  Good peripheral perfusion. RRR. No peripheral edema.  RESP:  Normal effort. LCTAB. No retractions.  NEURO: Cranial nerves II-XII intact.   ED Results / Procedures / Treatments   Labs (all labs ordered are listed, but only abnormal results are displayed) Labs Reviewed  RESP PANEL BY RT-PCR (RSV, FLU A&B, COVID)   [id number]  GROUP A STREP BY PCR   RADIOLOGY  I personally viewed and evaluated these images as part of my medical decision making, as well as reviewing the written report by the radiologist.  ED Provider Interpretation: Chest x-ray does not show any focal consolidation.  DG Chest 2 View Result Date: 07/20/2023 CLINICAL DATA:  Cough, headache, sore throat EXAM: CHEST - 2 VIEW COMPARISON:  08/16/2022 FINDINGS: The heart size and mediastinal contours are within normal limits. Both lungs are clear. The visualized skeletal structures are unremarkable. IMPRESSION: No active cardiopulmonary disease. Electronically Signed   By: Minerva Fester M.D.   On: 07/20/2023 20:59    PROCEDURES:  Critical Care performed: No  Procedures   MEDICATIONS ORDERED IN ED: Medications  acetaminophen (TYLENOL) tablet 1,000 mg (1,000 mg Oral Given 07/20/23 2254)     IMPRESSION / MDM / ASSESSMENT AND PLAN / ED COURSE  I reviewed the triage vital signs and the nursing notes.  28 y.o. female presents to the emergency department for evaluation and treatment of cough and sore throat. See HPI for further details.   Differential diagnosis includes, but is not limited to viral URI, strep pharyngitis, viral pharyngitis  Patient's presentation is most consistent with acute complicated illness / injury requiring diagnostic workup.  Patient is alert and oriented.  She is hemodynamically stable and afebrile.  Physical exam findings are stated above.  Respiratory panel, rapid strep test and chest x-ray are all reassuring.  Symptomatic care treatment at home education provided to patient.  Tylenol given in ED.  Patient request refill on albuterol.  Patient is in stable condition for discharge home.  Encouraged to follow-up with primary care as needed.  ED return precaution discussed.   FINAL CLINICAL IMPRESSION(S) / ED DIAGNOSES   Final diagnoses:  Viral URI with cough   Rx / DC Orders    ED Discharge Orders          Ordered    albuterol (VENTOLIN HFA) 108 (90 Base) MCG/ACT inhaler  Every 6 hours PRN        07/20/23 2210    benzonatate (TESSALON PERLES) 100 MG capsule  3 times daily PRN        07/20/23 2210             Note:  This document was prepared using Dragon voice recognition software and may include unintentional dictation errors.    Romeo Apple, Laticia Vannostrand A, PA-C 07/20/23 2312    Jene Every, MD 07/21/23 [phone]

## 2023-07-20 NOTE — ED Triage Notes (Signed)
Pt ambulatory to triage. Pt reports cough, headache, sore throat.  Sx for 4 days.  Pt taking otc meds without relief.

## 2023-07-20 NOTE — Discharge Instructions (Addendum)
You were evaluated in the ED for cough and sore throat.  Your respiratory panel which includes influenza, COVID, and RSV are negative.  Your strep test is negative.  And your chest x-ray is normal.  This is more than likely a viral upper respiratory infection.  A viral cough may last up to 2-3 weeks.   Take tylenol or ibuprofen for pain or fever as directed.   Stay hydrated by drinking plenty of fluids to thin mucus. Get adequate amount of sleep and avoid overexertion. Consider a humidifier at night. Warm teas and a spoonful of honey may help reduce cough frequency. Follow up with your primary care provider as needed.   For sore throat use throat over the counter lozenges or Chloraseptic spray.  Gargle with warm salt water several times daily

## 2024-02-24 IMAGING — DX DG KNEE COMPLETE 4+V*R*
4 series · 4 of 4 positions shown · non-contrast
Comparison: None.

CLINICAL DATA: Right knee pain.

EXAM:
RIGHT KNEE - COMPLETE 4+ VIEW

[knee ap]
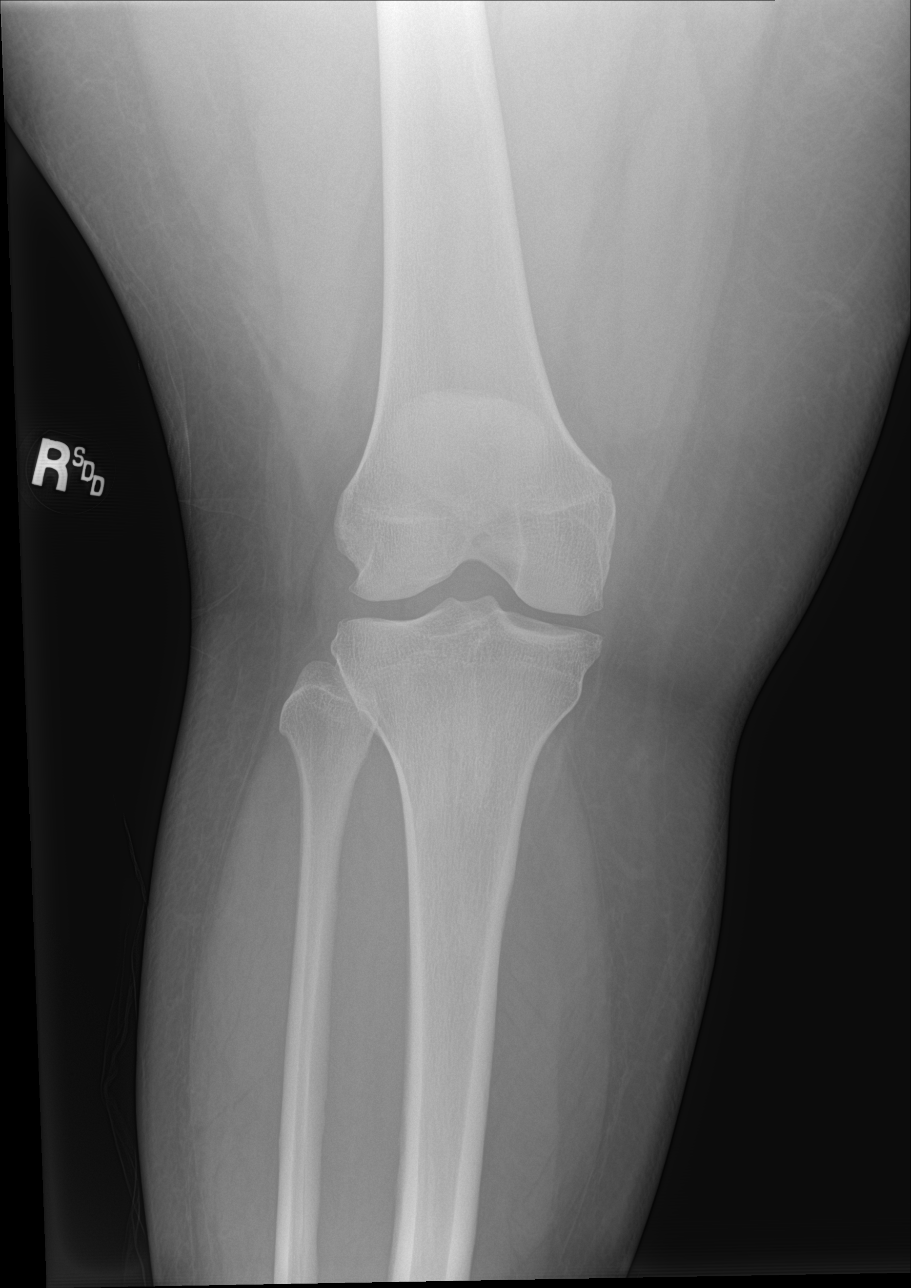

[knee obl (1 of 2)]
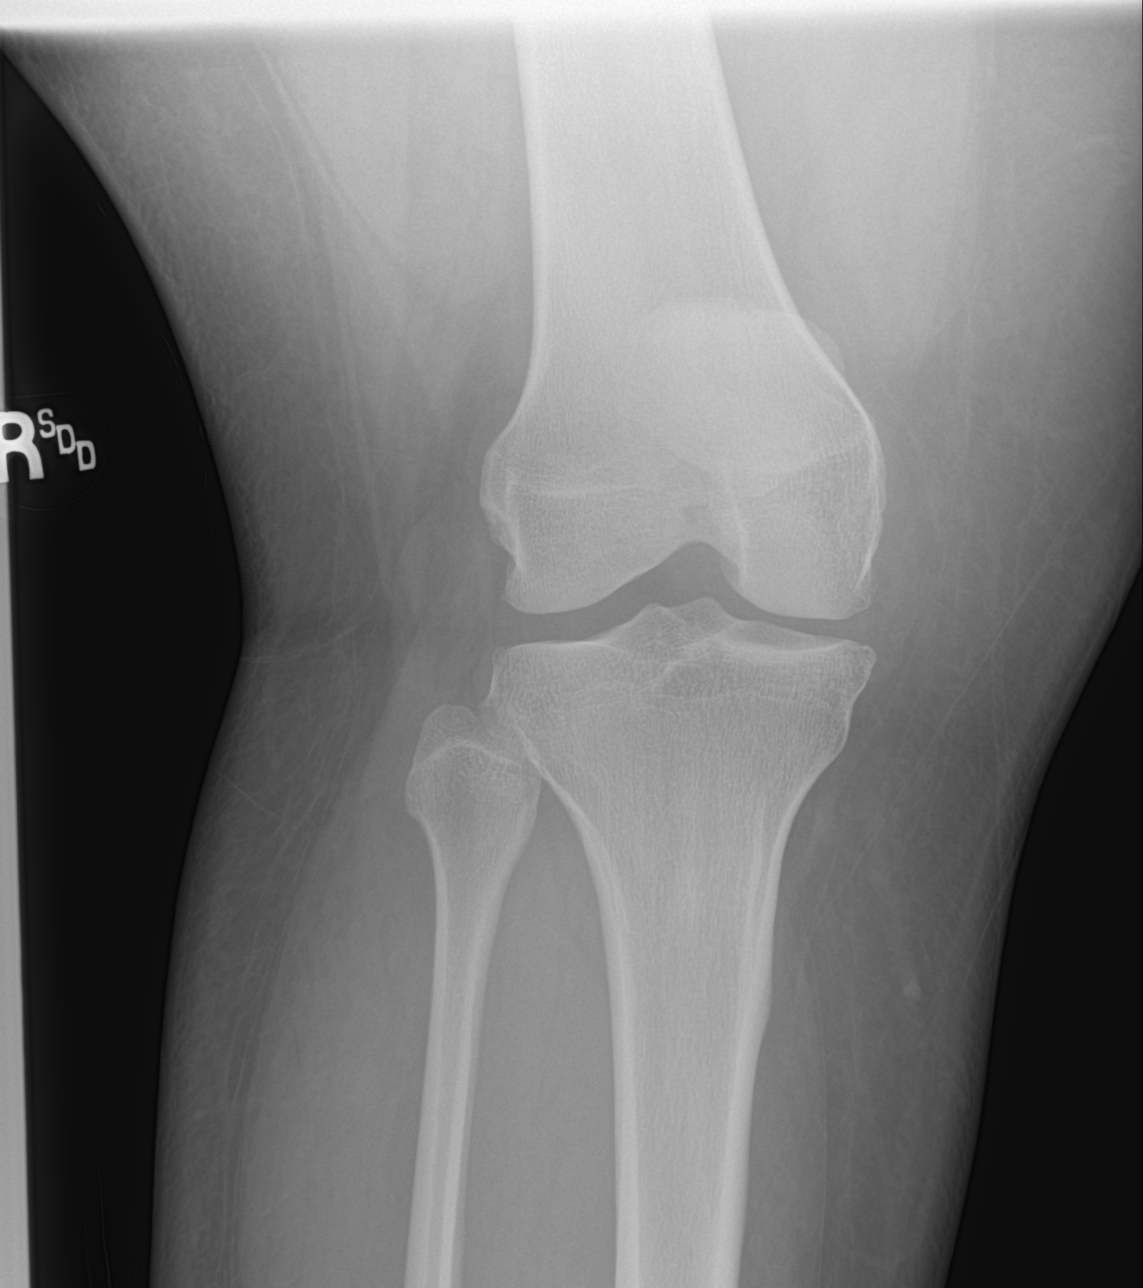

[knee obl (2 of 2)]
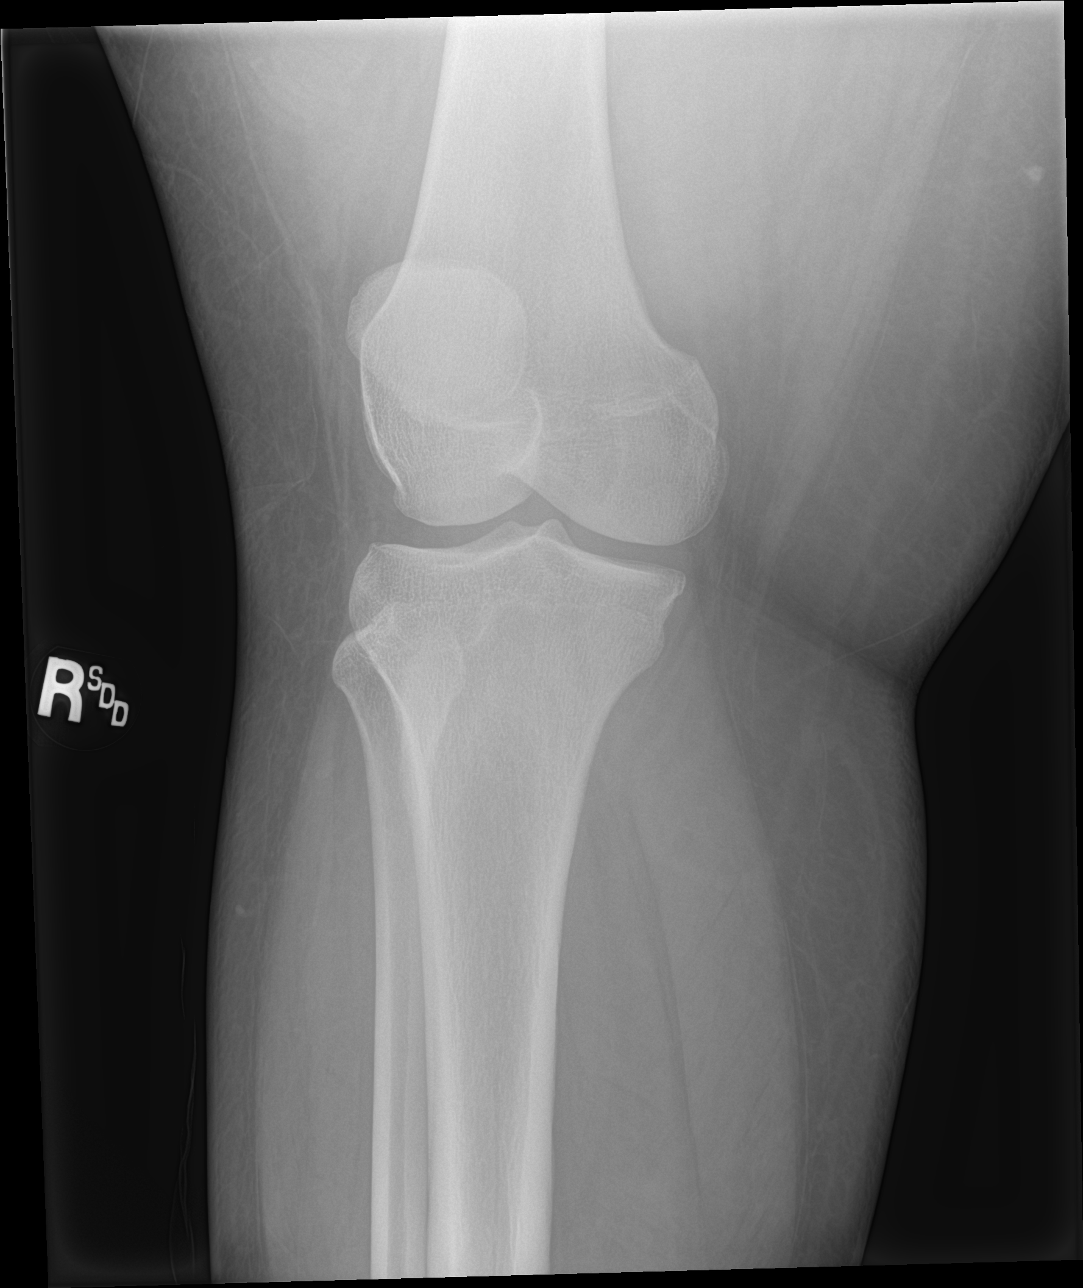

[knee lat]
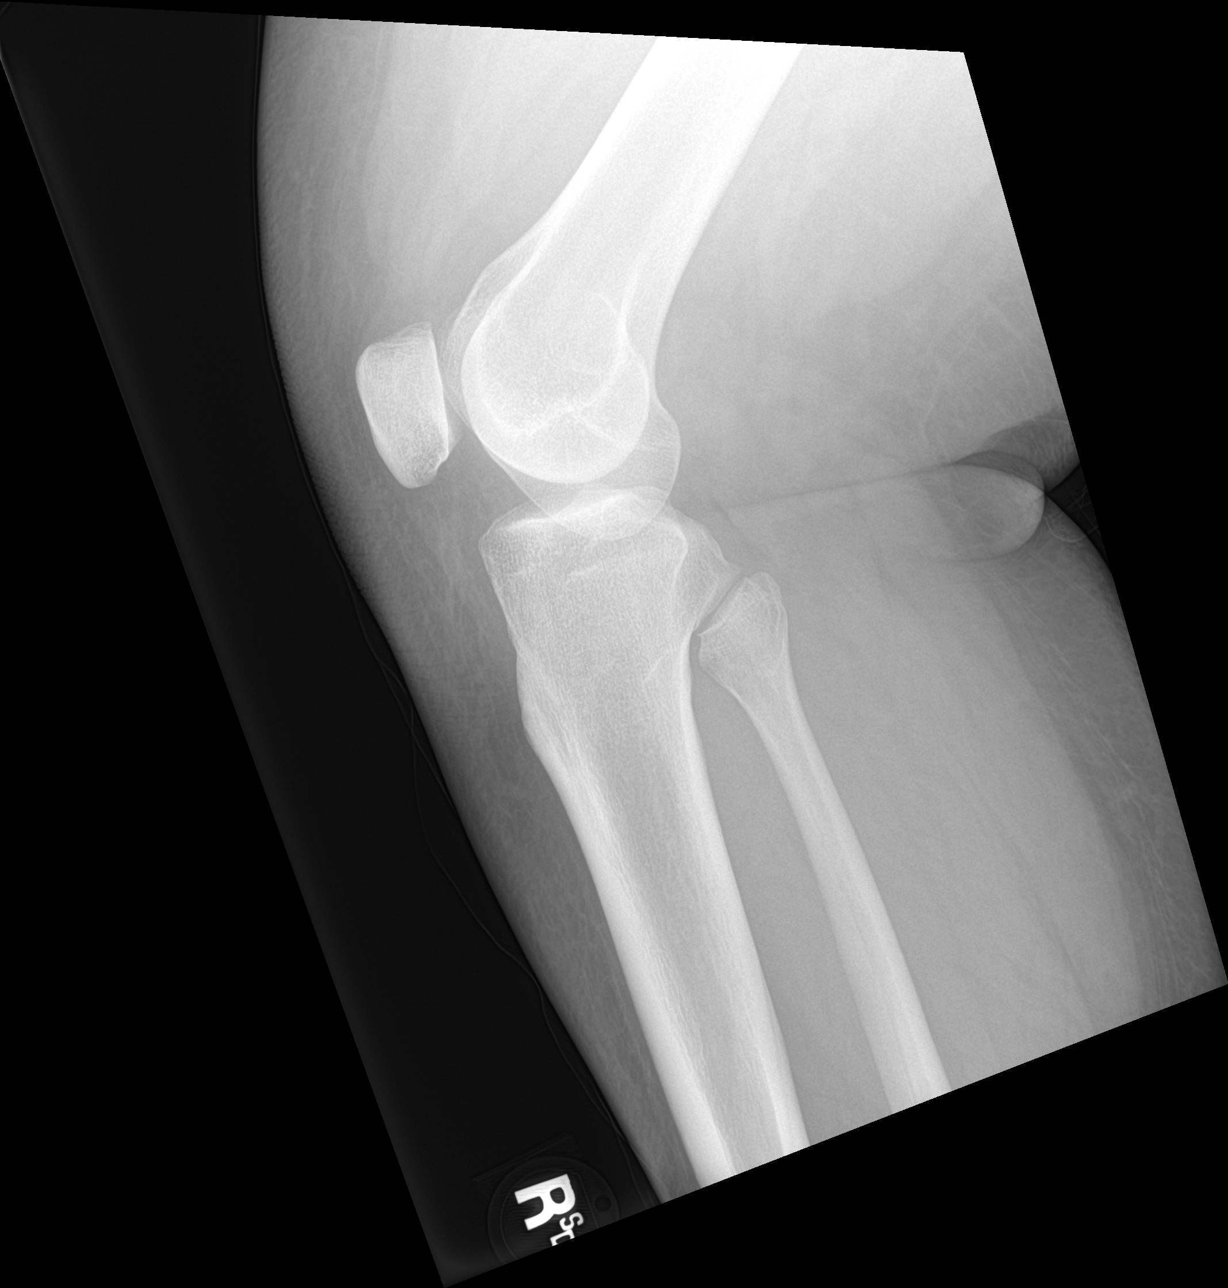

[4 of 4 positions shown; findings below may reference images not displayed]

FINDINGS: No evidence of fracture, dislocation, or joint effusion. No evidence
of arthropathy or other focal bone abnormality. Soft tissues are
unremarkable.
IMPRESSION: Negative.

## 2024-04-05 DIAGNOSIS — Z23 Encounter for immunization: Secondary | ICD-10-CM

## 2024-07-20 ENCOUNTER — Other Ambulatory Visit

## 2024-07-23 ENCOUNTER — Other Ambulatory Visit

## 2024-07-27 ENCOUNTER — Other Ambulatory Visit

## 2024-07-30 ENCOUNTER — Other Ambulatory Visit

## 2024-08-03 ENCOUNTER — Other Ambulatory Visit

## 2024-08-06 ENCOUNTER — Other Ambulatory Visit
